# Patient Record
Sex: Female | Born: 1980 | Hispanic: Yes | Smoking: Never smoker
Health system: Southern US, Community
[De-identification: ages and names within clinical notes are randomized; demographics above are authoritative.]

## PROBLEM LIST (undated history)

## (undated) DIAGNOSIS — IMO0002 Reserved for concepts with insufficient information to code with codable children: Secondary | ICD-10-CM

## (undated) DIAGNOSIS — I10 Essential (primary) hypertension: Secondary | ICD-10-CM

## (undated) DIAGNOSIS — T7840XA Allergy, unspecified, initial encounter: Secondary | ICD-10-CM

## (undated) HISTORY — DX: Allergy, unspecified, initial encounter: T78.40XA

## (undated) HISTORY — DX: Reserved for concepts with insufficient information to code with codable children: IMO0002

## (undated) HISTORY — DX: Essential (primary) hypertension: I10

---

## 2002-08-09 ENCOUNTER — Other Ambulatory Visit: Admission: RE | Admit: 2002-08-09 | Discharge: 2002-08-09 | Payer: Self-pay | Admitting: Obstetrics and Gynecology

## 2002-10-15 ENCOUNTER — Inpatient Hospital Stay (HOSPITAL_COMMUNITY): Admission: AD | Admit: 2002-10-15 | Discharge: 2002-10-17 | Payer: Self-pay | Admitting: Family Medicine

## 2004-05-16 ENCOUNTER — Inpatient Hospital Stay (HOSPITAL_COMMUNITY): Admission: AD | Admit: 2004-05-16 | Discharge: 2004-05-16 | Payer: Self-pay | Admitting: Obstetrics & Gynecology

## 2004-05-17 ENCOUNTER — Inpatient Hospital Stay (HOSPITAL_COMMUNITY): Admission: AD | Admit: 2004-05-17 | Discharge: 2004-05-19 | Payer: Self-pay | Admitting: *Deleted

## 2004-05-19 ENCOUNTER — Encounter (INDEPENDENT_AMBULATORY_CARE_PROVIDER_SITE_OTHER): Payer: Self-pay | Admitting: *Deleted

## 2007-09-20 ENCOUNTER — Ambulatory Visit: Payer: Self-pay | Admitting: Family Medicine

## 2007-09-20 ENCOUNTER — Encounter: Payer: Self-pay | Admitting: Family Medicine

## 2007-09-20 LAB — CONVERTED CEMR LAB
Basophils Relative: 0 % (ref 0–1)
Eosinophils Absolute: 0.1 10*3/uL (ref 0.0–0.7)
HCT: 40.7 % (ref 36.0–46.0)
Hepatitis B Surface Ag: NEGATIVE
Lymphocytes Relative: 20 % (ref 12–46)
Lymphs Abs: 1.9 10*3/uL (ref 0.7–3.3)
MCV: 95.3 fL (ref 78.0–100.0)
Neutro Abs: 7.1 10*3/uL (ref 1.7–7.7)
Neutrophils Relative %: 74 % (ref 43–77)
Platelets: 290 10*3/uL (ref 150–400)
Rh Type: POSITIVE

## 2007-09-27 ENCOUNTER — Ambulatory Visit: Payer: Self-pay | Admitting: Family Medicine

## 2007-10-13 ENCOUNTER — Encounter: Payer: Self-pay | Admitting: Family Medicine

## 2007-10-13 ENCOUNTER — Ambulatory Visit: Payer: Self-pay | Admitting: Family Medicine

## 2007-10-13 LAB — CONVERTED CEMR LAB
GC Probe Amp, Genital: NEGATIVE
Whiff Test: NEGATIVE

## 2007-11-17 ENCOUNTER — Ambulatory Visit: Payer: Self-pay | Admitting: Family Medicine

## 2007-11-17 LAB — CONVERTED CEMR LAB
Nitrite: NEGATIVE
Protein, U semiquant: NEGATIVE

## 2007-11-21 ENCOUNTER — Telehealth: Payer: Self-pay | Admitting: Family Medicine

## 2007-11-21 ENCOUNTER — Encounter: Payer: Self-pay | Admitting: Family Medicine

## 2007-11-21 ENCOUNTER — Telehealth (INDEPENDENT_AMBULATORY_CARE_PROVIDER_SITE_OTHER): Payer: Self-pay | Admitting: *Deleted

## 2007-11-21 ENCOUNTER — Ambulatory Visit (HOSPITAL_COMMUNITY): Admission: RE | Admit: 2007-11-21 | Discharge: 2007-11-21 | Payer: Self-pay | Admitting: Family Medicine

## 2007-11-22 ENCOUNTER — Encounter: Payer: Self-pay | Admitting: Family Medicine

## 2007-12-21 ENCOUNTER — Ambulatory Visit: Payer: Self-pay | Admitting: Family Medicine

## 2007-12-21 LAB — CONVERTED CEMR LAB
Nitrite: NEGATIVE
Protein, U semiquant: NEGATIVE

## 2008-01-18 ENCOUNTER — Ambulatory Visit: Payer: Self-pay | Admitting: Family Medicine

## 2008-01-18 ENCOUNTER — Encounter: Payer: Self-pay | Admitting: Family Medicine

## 2008-01-18 LAB — CONVERTED CEMR LAB: GTT, 1 hr: 133 mg/dL

## 2008-02-23 ENCOUNTER — Ambulatory Visit: Payer: Self-pay | Admitting: Family Medicine

## 2008-02-23 LAB — CONVERTED CEMR LAB: Glucose, Urine, Semiquant: NEGATIVE

## 2008-02-29 ENCOUNTER — Ambulatory Visit: Payer: Self-pay | Admitting: Family Medicine

## 2008-03-19 ENCOUNTER — Ambulatory Visit: Payer: Self-pay | Admitting: Family Medicine

## 2008-03-27 ENCOUNTER — Ambulatory Visit: Payer: Self-pay | Admitting: Family Medicine

## 2008-03-27 ENCOUNTER — Encounter: Payer: Self-pay | Admitting: Family Medicine

## 2008-03-27 LAB — CONVERTED CEMR LAB: Glucose, Urine, Semiquant: NEGATIVE

## 2008-04-01 ENCOUNTER — Inpatient Hospital Stay (HOSPITAL_COMMUNITY): Admission: AD | Admit: 2008-04-01 | Discharge: 2008-04-01 | Payer: Self-pay | Admitting: Obstetrics & Gynecology

## 2008-04-01 ENCOUNTER — Ambulatory Visit: Payer: Self-pay | Admitting: *Deleted

## 2008-04-02 ENCOUNTER — Inpatient Hospital Stay (HOSPITAL_COMMUNITY): Admission: AD | Admit: 2008-04-02 | Discharge: 2008-04-03 | Payer: Self-pay | Admitting: Obstetrics & Gynecology

## 2008-04-02 ENCOUNTER — Encounter: Payer: Self-pay | Admitting: Family Medicine

## 2008-04-02 ENCOUNTER — Ambulatory Visit: Payer: Self-pay | Admitting: Family

## 2008-05-07 ENCOUNTER — Ambulatory Visit: Payer: Self-pay | Admitting: Family Medicine

## 2008-05-07 DIAGNOSIS — R8789 Other abnormal findings in specimens from female genital organs: Secondary | ICD-10-CM

## 2008-05-16 ENCOUNTER — Telehealth: Payer: Self-pay | Admitting: *Deleted

## 2008-06-18 ENCOUNTER — Ambulatory Visit: Payer: Self-pay | Admitting: Family Medicine

## 2008-06-25 ENCOUNTER — Encounter: Payer: Self-pay | Admitting: Family Medicine

## 2008-06-25 ENCOUNTER — Ambulatory Visit: Payer: Self-pay | Admitting: Family Medicine

## 2008-07-22 ENCOUNTER — Ambulatory Visit: Payer: Self-pay | Admitting: Family Medicine

## 2008-07-22 LAB — CONVERTED CEMR LAB
MCHC: 33.2 g/dL (ref 30.0–36.0)
MCV: 97.2 fL (ref 78.0–100.0)
Platelets: 302 10*3/uL (ref 150–400)
RBC: 4.31 M/uL (ref 3.87–5.11)

## 2008-07-29 ENCOUNTER — Encounter: Payer: Self-pay | Admitting: Family Medicine

## 2008-08-02 ENCOUNTER — Telehealth: Payer: Self-pay | Admitting: Family Medicine

## 2008-08-12 ENCOUNTER — Ambulatory Visit: Payer: Self-pay | Admitting: Family Medicine

## 2008-09-02 ENCOUNTER — Ambulatory Visit: Payer: Self-pay | Admitting: Family Medicine

## 2008-09-03 ENCOUNTER — Encounter: Payer: Self-pay | Admitting: Family Medicine

## 2008-09-19 ENCOUNTER — Ambulatory Visit: Payer: Self-pay | Admitting: Family Medicine

## 2008-09-27 ENCOUNTER — Ambulatory Visit: Payer: Self-pay | Admitting: Family Medicine

## 2008-10-25 ENCOUNTER — Ambulatory Visit: Payer: Self-pay | Admitting: Family Medicine

## 2008-10-30 ENCOUNTER — Encounter: Payer: Self-pay | Admitting: Family Medicine

## 2008-10-30 ENCOUNTER — Ambulatory Visit: Payer: Self-pay | Admitting: Family Medicine

## 2008-10-30 LAB — CONVERTED CEMR LAB: Whiff Test: NEGATIVE

## 2008-10-31 ENCOUNTER — Encounter: Payer: Self-pay | Admitting: Family Medicine

## 2008-11-01 ENCOUNTER — Encounter: Payer: Self-pay | Admitting: Family Medicine

## 2008-11-15 ENCOUNTER — Ambulatory Visit: Payer: Self-pay | Admitting: Family Medicine

## 2008-11-25 ENCOUNTER — Ambulatory Visit: Payer: Self-pay | Admitting: Family Medicine

## 2008-11-25 ENCOUNTER — Encounter: Payer: Self-pay | Admitting: Family Medicine

## 2008-11-25 LAB — CONVERTED CEMR LAB
Glucose, Urine, Semiquant: NEGATIVE
Protein, U semiquant: NEGATIVE
Specific Gravity, Urine: 1.025
pH: 5.5

## 2008-12-02 LAB — CONVERTED CEMR LAB: Hepatitis B Surface Ag: NEGATIVE

## 2008-12-19 ENCOUNTER — Ambulatory Visit: Payer: Self-pay | Admitting: Family Medicine

## 2009-01-29 ENCOUNTER — Encounter (INDEPENDENT_AMBULATORY_CARE_PROVIDER_SITE_OTHER): Payer: Self-pay | Admitting: *Deleted

## 2009-01-29 ENCOUNTER — Ambulatory Visit: Payer: Self-pay | Admitting: Family Medicine

## 2009-03-28 ENCOUNTER — Encounter (INDEPENDENT_AMBULATORY_CARE_PROVIDER_SITE_OTHER): Payer: Self-pay | Admitting: Family Medicine

## 2009-03-28 ENCOUNTER — Ambulatory Visit: Payer: Self-pay | Admitting: Family Medicine

## 2009-03-28 LAB — CONVERTED CEMR LAB
Beta hcg, urine, semiquantitative: NEGATIVE
GC Probe Amp, Genital: NEGATIVE

## 2009-03-31 ENCOUNTER — Encounter (INDEPENDENT_AMBULATORY_CARE_PROVIDER_SITE_OTHER): Payer: Self-pay | Admitting: Family Medicine

## 2009-04-01 ENCOUNTER — Telehealth (INDEPENDENT_AMBULATORY_CARE_PROVIDER_SITE_OTHER): Payer: Self-pay | Admitting: *Deleted

## 2009-04-02 ENCOUNTER — Ambulatory Visit (HOSPITAL_COMMUNITY): Admission: RE | Admit: 2009-04-02 | Discharge: 2009-04-02 | Payer: Self-pay | Admitting: Family Medicine

## 2009-04-22 ENCOUNTER — Ambulatory Visit: Payer: Self-pay | Admitting: Family Medicine

## 2009-04-22 ENCOUNTER — Encounter: Payer: Self-pay | Admitting: Family Medicine

## 2009-04-25 ENCOUNTER — Encounter: Payer: Self-pay | Admitting: Family Medicine

## 2009-04-28 ENCOUNTER — Ambulatory Visit: Payer: Self-pay | Admitting: Family Medicine

## 2009-04-28 ENCOUNTER — Encounter: Payer: Self-pay | Admitting: Family Medicine

## 2009-09-15 ENCOUNTER — Ambulatory Visit: Payer: Self-pay | Admitting: Family Medicine

## 2009-11-21 ENCOUNTER — Encounter: Payer: Self-pay | Admitting: Family Medicine

## 2009-11-21 ENCOUNTER — Ambulatory Visit: Payer: Self-pay | Admitting: Family Medicine

## 2009-11-21 LAB — CONVERTED CEMR LAB: Beta hcg, urine, semiquantitative: NEGATIVE

## 2009-12-13 DIAGNOSIS — IMO0002 Reserved for concepts with insufficient information to code with codable children: Secondary | ICD-10-CM

## 2009-12-13 HISTORY — DX: Reserved for concepts with insufficient information to code with codable children: IMO0002

## 2009-12-31 ENCOUNTER — Encounter: Payer: Self-pay | Admitting: Family Medicine

## 2009-12-31 ENCOUNTER — Ambulatory Visit: Payer: Self-pay | Admitting: Family Medicine

## 2009-12-31 DIAGNOSIS — R109 Unspecified abdominal pain: Secondary | ICD-10-CM | POA: Insufficient documentation

## 2009-12-31 LAB — CONVERTED CEMR LAB
Chlamydia, DNA Probe: NEGATIVE
GC Probe Amp, Genital: NEGATIVE
Whiff Test: NEGATIVE

## 2010-01-29 ENCOUNTER — Ambulatory Visit: Payer: Self-pay | Admitting: Family Medicine

## 2010-02-03 ENCOUNTER — Telehealth: Payer: Self-pay | Admitting: Family Medicine

## 2010-02-05 ENCOUNTER — Encounter (INDEPENDENT_AMBULATORY_CARE_PROVIDER_SITE_OTHER): Payer: Self-pay | Admitting: Family Medicine

## 2010-02-12 ENCOUNTER — Encounter: Payer: Self-pay | Admitting: Family Medicine

## 2010-02-16 ENCOUNTER — Encounter: Payer: Self-pay | Admitting: Family Medicine

## 2010-02-25 ENCOUNTER — Ambulatory Visit: Payer: Self-pay | Admitting: Obstetrics and Gynecology

## 2010-02-25 ENCOUNTER — Encounter: Payer: Self-pay | Admitting: Family Medicine

## 2010-02-26 ENCOUNTER — Encounter: Payer: Self-pay | Admitting: Family

## 2010-02-26 LAB — CONVERTED CEMR LAB: Chlamydia, DNA Probe: NEGATIVE

## 2010-02-27 ENCOUNTER — Encounter: Payer: Self-pay | Admitting: Family

## 2010-02-27 LAB — CONVERTED CEMR LAB: Trich, Wet Prep: NONE SEEN

## 2010-03-06 ENCOUNTER — Emergency Department (HOSPITAL_COMMUNITY): Admission: EM | Admit: 2010-03-06 | Discharge: 2010-03-06 | Payer: Self-pay | Admitting: Emergency Medicine

## 2010-03-12 ENCOUNTER — Ambulatory Visit (HOSPITAL_COMMUNITY): Admission: RE | Admit: 2010-03-12 | Discharge: 2010-03-12 | Payer: Self-pay | Admitting: Obstetrics and Gynecology

## 2010-03-18 ENCOUNTER — Ambulatory Visit: Payer: Self-pay | Admitting: Obstetrics and Gynecology

## 2010-03-24 ENCOUNTER — Ambulatory Visit: Payer: Self-pay | Admitting: Family Medicine

## 2010-03-24 DIAGNOSIS — R87610 Atypical squamous cells of undetermined significance on cytologic smear of cervix (ASC-US): Secondary | ICD-10-CM

## 2010-03-24 LAB — CONVERTED CEMR LAB: Pap Smear: UNDETERMINED

## 2010-04-06 ENCOUNTER — Encounter: Payer: Self-pay | Admitting: Family Medicine

## 2010-04-09 ENCOUNTER — Ambulatory Visit: Payer: Self-pay | Admitting: Family Medicine

## 2010-12-21 ENCOUNTER — Emergency Department (HOSPITAL_COMMUNITY)
Admission: EM | Admit: 2010-12-21 | Discharge: 2010-12-21 | Payer: Self-pay | Source: Home / Self Care | Admitting: Emergency Medicine

## 2011-01-04 ENCOUNTER — Ambulatory Visit: Admission: RE | Admit: 2011-01-04 | Discharge: 2011-01-04 | Payer: Self-pay | Source: Home / Self Care

## 2011-01-04 DIAGNOSIS — R21 Rash and other nonspecific skin eruption: Secondary | ICD-10-CM | POA: Insufficient documentation

## 2011-01-04 DIAGNOSIS — I776 Arteritis, unspecified: Secondary | ICD-10-CM | POA: Insufficient documentation

## 2011-01-12 NOTE — Consult Note (Signed)
Summary: [facility]  [facility]   Imported By: Clydell Hakim 04/13/2010 11:39:57  _____________________________________________________________________  External Attachment:    Type:   Image     Comment:   External Document

## 2011-01-12 NOTE — Assessment & Plan Note (Signed)
Summary: rash, boils   Vital Signs:  Patient profile:   30 year old female Weight:      140.6 pounds BMI:     26.66 Temp:     97.9 degrees F oral Pulse rate:   96 / minute Pulse rhythm:   regular BP sitting:   117 / 79  (right arm) Cuff size:   regular  Vitals Entered By: Loralee Pacas CMA (January 29, 2010 1:49 PM) CC: rash, bumps on buttocks Comments pt has a hx of red spots that just "pop" up all over her body.  this started 3 wks ago no changes in soaps   Primary Care Provider:  Lequita Asal  MD  CC:  rash and bumps on buttocks.  History of Present Illness: 30 y/o female with h/o recurrent rash > 1 1/2 years. has been evaluated by dermatologist and numerous physicians at [facility]. has tried several topical steroids over that period of time. no known triggers. no exact diagnosis from past bx, per patient. shaves legs twice weekly using gel. because of rash, feels it is affecting her relationship and attractiveness to her husband.   bumps on buttocks- recurrent issue for several months. denies fever, chills. occasional drainage. some tenderness. has never required I&D.   Current Medications (verified): 1)  Ibuprofen 800 Mg Tabs (Ibuprofen) .Marland Kitchen.. 1 Tab By Mouth Q 8 Hours As Needed Pain 2)  Doxycycline Hyclate 100 Mg Caps (Doxycycline Hyclate) .... One Tab By Mouth Two Times A Day X10 Days 3)  Triamcinolone Acetonide 0.025 % Oint (Triamcinolone Acetonide) .... Apply To Legs Two Times A Day As Needed For Rash. Please Put Label in Spanish. Disp 90g.  Allergies (verified): No Known Drug Allergies  Physical Exam  General:  Well-developed,well-nourished,in no acute distress; alert,appropriate and cooperative throughout examination VSS Skin:  diffuse maculopapular rash on bilateral lower extremities from inguinal creases to ankles. sparing feet and upper body. on bilateral buttocks, 2-3 papular lesions and areas of excoriation. no induration, fluctuance, or warmth.   Impression &  Recommendations:  Problem # 1:  SKIN RASH (ICD-782.1) Assessment Deteriorated seems consistent with folliculitis. will treat with doxycycline. will try topical steroid as well.   Her updated medication list for this problem includes:    Triamcinolone Acetonide 0.025 % Oint (Triamcinolone acetonide) .Marland Kitchen... Apply to legs two times a day as needed for rash. please put label in spanish. disp 90g.  Orders: [facility]- Est Level  3 (62130)  Problem # 2:  CARBUNCLE AND FURUNCLE OF BUTTOCK (ICD-680.5) Assessment: New no area amenable to I&D. will treat with oral antibiotics.   Orders: [facility]- Est Level  3 (86578)  Complete Medication List: 1)  Ibuprofen 800 Mg Tabs (Ibuprofen) .Marland Kitchen.. 1 tab by mouth q 8 hours as needed pain 2)  Doxycycline Hyclate 100 Mg Caps (Doxycycline hyclate) .... One tab by mouth two times a day x10 days 3)  Triamcinolone Acetonide 0.025 % Oint (Triamcinolone acetonide) .... Apply to legs two times a day as needed for rash. please put label in spanish. disp 90g. Prescriptions: TRIAMCINOLONE ACETONIDE 0.025 % OINT (TRIAMCINOLONE ACETONIDE) apply to legs two times a day as needed for RASH. please put label in SPANISH. disp 90g.  #1 x 0   Entered and Authorized by:   Lequita Asal  MD   Signed by:   Lequita Asal  MD on 02/02/2010   Method used:   Electronically to        [facility] [address] (retail)  [address]       [place], [place]  [address]       Ph: [phone]       Fax: [phone]   RxID:   [id number] DOXYCYCLINE HYCLATE 100 MG CAPS (DOXYCYCLINE HYCLATE) one tab by mouth two times a day x10 days  #20 x 0   Entered and Authorized by:   Lequita Asal  MD   Signed by:   Lequita Asal  MD on 01/29/2010   Method used:   Electronically to        [facility] [address] (retail)       [phone]       [place], [place]  [address]       Ph: [phone]       Fax: [phone]    RxID:   [id number]

## 2011-01-12 NOTE — Progress Notes (Signed)
Summary: referral  Phone Note Call from Patient Call back at [phone]   Caller: Drucie Ip for [name] [facility] Summary of Call: pt is wanting to go to derm and needs a referral Initial call taken by: De Nurse,  February 03, 2010 9:47 AM  Follow-up for Phone Call        will forward to MD to put in order. Follow-up by: Theresia Lo RN,  February 03, 2010 10:42 AM

## 2011-01-12 NOTE — Consult Note (Signed)
Summary: [facility]  [facility]   Imported By: Clydell Hakim 07/16/2010 12:08:27  _____________________________________________________________________  External Attachment:    Type:   Image     Comment:   External Document

## 2011-01-12 NOTE — Miscellaneous (Signed)
Summary: rash on legs  Clinical Lists Changes she called again asking for somethiing for her legs. states it is somewhat better. asked her to wait until monday & if worsening, call back for appt. used interpretor.Marland KitchenMarland KitchenMarland KitchenGolden Circle RN  February 12, 2010 10:00 AM  i sent steroid cream last visit.  Lequita Asal  MD  February 12, 2010 10:40 AM

## 2011-01-12 NOTE — Assessment & Plan Note (Signed)
Summary: abdominal pain when having sex   Vital Signs:  Patient profile:   30 year old female Weight:      138.7 pounds Temp:     98.3 degrees F oral Pulse rate:   67 / minute Pulse rhythm:   regular BP sitting:   113 / 76  (left arm) Cuff size:   regular  Vitals Entered By: Loralee Pacas CMA (December 31, 2009 2:21 PM)  Primary Care Provider:  Lequita Asal  MD   History of Present Illness: patient has been having pelvic/vaginal pain for several weeks.  Mostly w/ sex.  no discharge.  has never had STD.  Has IUD.  No dysuria.  No fevers. had exact same pain last year which went away by itself. normal U/S and labs. no itching.pain so bad she can't have sex.  patient denies h/o sexual abuse, any sadness/depression, concerns for safety with husband, etc. denies constipation, diarrhea, dysuria.   Current Medications (verified): 1)  Ibuprofen 800 Mg Tabs (Ibuprofen) .Marland Kitchen.. 1 Tab By Mouth Q 8 Hours As Needed Pain  Allergies (verified): No Known Drug Allergies  Physical Exam  General:  Well-developed,well-nourished,in no acute distress; alert,appropriate and cooperative throughout examination VSS Genitalia:  Normal introitus for age, no external lesions, no vaginal discharge, mucosa pink and moist, no vaginal or cervical lesions, no vaginal atrophy, no friaility or hemorrhage, normal uterus size and position, no adnexal masses or tenderness. no pain with insertion of speculum. minimal pain with bimanual exam   Impression & Recommendations:  Problem # 1:  ABDOMINAL PAIN, UNSPECIFIED SITE (ICD-789.00) Assessment Unchanged etiology unclear. wet prep unremarkable. ?underlying psych cause, but normal depression screen and patient denies. will refer to GYN for chronic pelvic pain. patient declines any meds at this time.   Orders: GC/Chlamydia-[facility] (87591/87491) Wet Prep- [facility] ([address]) [facility]- Est Level  3 (60454)  Complete Medication List: 1)  Ibuprofen 800 Mg Tabs (Ibuprofen) .Marland Kitchen.. 1 tab  by mouth q 8 hours as needed pain  Other Orders: Gynecologic Referral (Gyn)  Laboratory Results  Date/Time Received: December 31, 2009 2:56 PM  Date/Time Reported: December 31, 2009 3:11 PM   [employer] Source: vag WBC/hpf: 5-10 Bacteria/hpf: 2+  [place] Clue cells/hpf: none  Negative whiff Yeast/hpf: none Trichomonas/hpf: none Comments: ...............test performed by......Marland KitchenBonnie A. [place], MLS (ASCP)cm

## 2011-01-12 NOTE — Miscellaneous (Signed)
Summary: wants referral to Dr. Terri Piedra  Clinical Lists Changes Helmut Muster, our interpretor called asking that we set pt up with Dr. Terri Piedra at pt's request. sent to pcp to review & order if desired.Golden Circle RN  February 05, 2010 10:57 AM  we will. unfortunately, patient without insurance. only [place]. but referral in.  Lequita Asal  MD  February 05, 2010 11:08 AM  Orders: Added new Referral order of Dermatology Referral (Derma) - Signed will fax referral to [facility]. Theresia Lo RN  February 05, 2010 5:08 PM

## 2011-01-12 NOTE — Miscellaneous (Signed)
Summary: Consent to colposcopy  Consent to colposcopy   Imported By: Knox Royalty 05/21/2010 10:16:57  _____________________________________________________________________  External Attachment:    Type:   Image     Comment:   External Document

## 2011-01-12 NOTE — Assessment & Plan Note (Signed)
Summary: [facility]   Vital Signs:  Patient profile:   30 year old female Height:      61 inches Weight:      141.2 pounds BMI:     26.78 Temp:     98.1 degrees F oral Pulse rate:   76 / minute BP sitting:   115 / 81  (left arm) Cuff size:   regular  Vitals Entered By: Gladstone Pih (March 24, 2010 9:09 AM)  Nutrition Counseling: Patient's BMI is greater than 25 and therefore counseled on weight management options. CC: CPE and pap Is Patient Diabetic? No Pain Assessment Patient in pain? no        Primary Care Provider:  Lequita Asal  MD  CC:  CPE and pap.  History of Present Illness: 30 y/o female here for CPE  rash- resolved after seeing Dr. Terri Piedra. Diagnosed with Purpura NOS  dyspareunia- resolved since ultrasound and seeing GYN. continued decreased libido 2/2 fear of pain.   no other changes to medical history. denies depression/sadness. feels safe at home.   Habits & Providers  Alcohol-Tobacco-Diet     Tobacco Status: never  Exercise-Depression-Behavior     Have you felt down or hopeless? no     Have you felt little pleasure in things? no     Depression Counseling: not indicated; screening negative for depression     STD Risk: never  Current Medications (verified): 1)  None  Allergies (verified): No Known Drug Allergies  Past History:  Past medical, surgical, family and social histories (including risk factors) reviewed, and no changes noted (except as noted below).  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 mother- DM Type II, Hypercholesterolemia  Social History: Reviewed history from 11/15/2008 and no changes required. Married Native of [place] in [place]. 6 years Language: Spanish, reads it well  Daughter born 4/09 Never Smoked STD Risk:  never  Physical Exam  General:   Well-developed,well-nourished,in no acute distress; alert,appropriate and cooperative throughout examination Eyes:  No corneal or conjunctival inflammation noted. EOMI. Perrla. Vision grossly normal. Ears:  External ear exam shows no significant lesions or deformities.   Hearing is grossly normal bilaterally. Nose:  External nasal examination shows no deformity or inflammation. Nasal mucosa are pink and moist without lesions or exudates. Mouth:  Oral mucosa and oropharynx without lesions or exudates.  Teeth in good repair. Neck:  No deformities, masses, or tenderness noted. Breasts:  No mass, nodules, thickening, tenderness, bulging, retraction, inflamation, nipple discharge or skin changes noted.   Lungs:  Normal respiratory effort, chest expands symmetrically. Lungs are clear to auscultation, no crackles or wheezes. Heart:  Normal rate and regular rhythm. S1 and S2 normal without gallop, murmur, click, rub or other extra sounds. Abdomen:  Bowel sounds positive,abdomen soft and non-tender without masses, organomegaly or hernias noted. Genitalia:  Normal introitus for age, no external lesions, no vaginal discharge, mucosa pink and moist, no vaginal or cervical lesions, no vaginal atrophy, no friaility or hemorrhage, normal uterus size and position, no adnexal masses or tenderness. IUD strings visualized at cervical os Pulses:  R and L carotid,radial,dorsalis pedis and posterior tibial pulses are full and equal bilaterally Extremities:  No clubbing, cyanosis, edema, or deformity noted with normal full range of motion of all joints.   Neurologic:  alert & oriented X3, cranial nerves II-XII intact, and gait normal.   Skin:  Intact without suspicious lesions or rashes Psych:  Oriented X3, memory intact for recent and remote, and normally interactive.     Impression & Recommendations:  Problem # 1:  ROUTINE GYNECOLOGICAL EXAMINATION (ICD-V72.31) Assessment New  pap completed. h/o LGSIL requiring  colposcopy. f/u as needed depending on results.   Orders: [facility] - Est  18-39 yrs (04540)  Problem # 2:  VAGINITIS, BACTERIAL (ICD-616.10) Assessment: New  rx for flagyl to pharmacy.  Her updated medication list for this problem includes:    Metronidazole 500 Mg Tabs (Metronidazole) ..... One tab by mouth two times a day x7 days  [facility] Pap Smear-[facility] ([id number]) Prescriptions: METRONIDAZOLE 500 MG TABS (METRONIDAZOLE) one tab by mouth two times a day x7 days  #14 x 0   Entered and Authorized by:   Lequita Asal  MD   Signed by:   Lequita Asal  MD on 03/24/2010   Method used:   Electronically to        [facility] [address] (retail)       [phone]       [place], [place]  [address]       Ph: [phone]       Fax: [phone]   RxID:   [id number]   Laboratory Results  Date/Time Received: March 24, 2010 9:37 AM  Date/Time Reported: March 24, 2010 9:44 AM   Wet [place] Source: vag WBC/hpf: 1-5 Bacteria/hpf: 2+  Cocci Clue cells/hpf: many  Positive whiff Yeast/hpf: none Trichomonas/hpf: none Comments: ...............test performed by......Marland KitchenBonnie A. [place], MLS (ASCP)cm     Appended Document: Orders Update    Clinical Lists Changes  Problems: Added new problem of ASCUS PAP (ICD-795.01) Orders: Added new Test order of HPV Typing-[facility] [phone]) - Signed

## 2011-01-12 NOTE — Assessment & Plan Note (Signed)
Summary: colpo abnormal pap,tcb   Vital Signs:  Patient profile:   30 year old female Height:      61 inches Weight:      139.8 pounds BMI:     26.51 Temp:     98.2 degrees F oral Pulse rate:   73 / minute BP sitting:   122 / 82  (left arm) Cuff size:   regular  Vitals Entered By: Gladstone Pih (April 09, 2010 9:21 AM) CC: Colpo Is Patient Diabetic? No Pain Assessment Patient in pain? no        Primary Care Provider:  Lequita Asal  MD  CC:  Colpo.  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 has myrena in place was seen recently at [facility] for dyspareunia--very occasional pain with intercourse, does not seem ot be positional. also can have this pain withoout intercourse. pain is over bladder area, sharp and crampy--but lasts only a minute and then self resolves  Habits & Providers  Alcohol-Tobacco-Diet     Tobacco Status: never  Allergies: No Known Drug Allergies  Past History:  Past Medical History: Reviewed history from 03/24/2010 and no changes required. [id number], 3 NSVD no medical problems normal colpo 06/2008 for f/u LGSIL  Physical Exam  Genitalia:  normal introitus, no external lesions, no vaginal discharge, and mucosa pink and moist.  Mirena strings coming from os Additional Exam:  Patient given informed consent, signed copy in the chart. Placed in lithotomy position. Cervix viewed with speculum and colposcope. Was the entire squamocolumnar junction seen?yes she has ectropion Any acetowhite lesions noted?none Any abnormalities seen with green filter?no Any abnormalities seen with application of Lugol's solution?no Was the endocervical canal sampled?no Were any cervical biopsies taken?no Were there any complications?no COMMENTS: Patient was given post procedure instructions:.no restrictions graciella was interpretor and present for entire visit    Impression & Recommendations:  Problem #  1:  ASCUS PAP (ICD-795.01)  clinicaly normal colpo repeat pap one year  Orders: Colposcopy w/out biopsy [facility] ([address])  Problem # 2:  PELVIC PAIN, CHRONIC (ICD-789.09)  was seen at [facility] for dysp[aeunia--they evidently did [place] and her IUD is projecting into myometrium.OBGYN offered rher emoval of IUD which she declined. After discussion today, it does not sound like classic dysapeunia--I wonder if she is having bladder spasm?? it seems very transient if it truly lasts only a minute. recommend otc ibuprofen and f/u if it worsens  Orders: [facility]- Est Level  3 ([address])

## 2011-01-14 NOTE — Assessment & Plan Note (Signed)
Summary: rash/red spot on her leg/mj/white team   Vital Signs:  Patient profile:   30 year old female Height:      61 inches Weight:      140 pounds BMI:     26.55 Temp:     98.3 degrees F oral Pulse rate:   74 / minute BP sitting:   119 / 71  (left arm) Cuff size:   regular  Vitals Entered By: Tessie Fass CMA (January 04, 2011 1:55 PM)   Primary Care Provider:  Lequita Asal  MD   History of Present Illness: 1. Rash on legs:  Pt returns to clinic for reevaluation of rash on her legs. The rash is described as a red spots that are itchy. They have been there for a couple of weeks.  This is a chronic problem.  She was last seen by Dermatology about 1 year ago.  She was given a shot and some pills and the rash improved.  She would like to be referred back there for the same treatment.    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  She has received Kenalog injection and Prednisone along with a Rx for Amoxicillin at last Dermatology visit.  Current Medications (verified): 1)  None  Allergies: No Known Drug Allergies  Past History:  Past Medical History: Reviewed history from 03/24/2010 and no changes required. [id number], 3 NSVD no medical problems normal colpo 06/2008 for f/u LGSIL  Social History: Reviewed history from 11/15/2008 and no changes required. Married Native of [place], [place], in [place]. 6 years Language: Spanish, reads it well  Daughter born 4/09 Never Smoked  Physical Exam  General:  Vitals reviewed.  Comfortable.  NAD Lungs:  Normal respiratory effort, chest expands symmetrically. Lungs are clear to auscultation, no crackles or wheezes. Heart:  Normal rate and regular rhythm. S1 and S2 normal without gallop, murmur, click, rub or other extra sounds. Skin:  Petechial rash on right leg.  Not particularly inflamed.  No surrounding cellulitis.   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  Has been followed by Dermatology.  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  It seems to respond well to steroids and has been gone for almost one year.  Gave her steroid injection here.  Advised her to follow up in 2 weeks to recheck.  If not better can always refer back to Dermatology.  Orders: [facility]- Est Level  3 (57846)  Patient Instructions: 1)  We will try and give you a steroid injection and see if that helps with your rash 2)  That is what you got at your last dermatology appointment 3)  Please schedule a follow up appointment in 2 week 4)  If not better with the injection in clinic we can always refer you back to dermatology   Orders Added: 1)  [facility]- Est Level  3 [[id number]]  Appended Document: rash/red spot on her leg/mj/white team     Allergies: No Known Drug Allergies   Other Orders: Solumedrol up to 125mg  ([id number])   Medication Administration  Injection # 1:    Medication: Solumedrol up to 125mg     Diagnosis: SKIN RASH (ICD-782.1)    Route: IM    Site: RUOQ gluteus  Exp Date: 05/2013    Lot #: Laray [id number]    Mfr: pfizer    Comments: 125mg  given    Patient tolerated injection without complications    Given by: Tessie Fass CMA (January 04, 2011 2:34 PM)  Orders Added: 1)  Solumedrol up to 125mg  [[id number]]

## 2011-01-20 ENCOUNTER — Ambulatory Visit: Payer: Self-pay | Admitting: Family Medicine

## 2011-02-01 ENCOUNTER — Ambulatory Visit: Payer: Self-pay | Admitting: Family Medicine

## 2011-04-05 ENCOUNTER — Ambulatory Visit (INDEPENDENT_AMBULATORY_CARE_PROVIDER_SITE_OTHER): Payer: Self-pay | Admitting: Family Medicine

## 2011-04-05 ENCOUNTER — Encounter: Payer: Self-pay | Admitting: Family Medicine

## 2011-04-05 ENCOUNTER — Other Ambulatory Visit (HOSPITAL_COMMUNITY)
Admission: RE | Admit: 2011-04-05 | Discharge: 2011-04-05 | Disposition: A | Discharge status: Home / Self Care | Payer: Self-pay | Source: Ambulatory Visit | Attending: Family Medicine | Admitting: Family Medicine

## 2011-04-05 VITALS — BP 126/70 | Temp 98.0°F | Ht 60.0 in | Wt 133.0 lb

## 2011-04-05 DIAGNOSIS — Z113 Encounter for screening for infections with a predominantly sexual mode of transmission: Secondary | ICD-10-CM | POA: Insufficient documentation

## 2011-04-05 DIAGNOSIS — N760 Acute vaginitis: Secondary | ICD-10-CM

## 2011-04-05 DIAGNOSIS — R8789 Other abnormal findings in specimens from female genital organs: Secondary | ICD-10-CM

## 2011-04-05 DIAGNOSIS — Z01419 Encounter for gynecological examination (general) (routine) without abnormal findings: Secondary | ICD-10-CM | POA: Insufficient documentation

## 2011-04-05 LAB — POCT WET PREP (WET MOUNT): Yeast Wet Prep HPF POC: NEGATIVE

## 2011-04-05 LAB — RPR

## 2011-04-05 NOTE — Assessment & Plan Note (Signed)
HIV, RPR, GC, Chlam, Wet prep samples sent.

## 2011-04-05 NOTE — Progress Notes (Signed)
Addended by: Tessie Fass on: 04/05/2011 04:44 PM   Modules accepted: Orders

## 2011-04-05 NOTE — Assessment & Plan Note (Signed)
Abnormal pap 2010, with normal colposcopy.  She is on annual schedule now for repeat pap. No risk factors on ROS (pt is married, with 3 children and not interested in fertility, she has IUD placed 2009).

## 2011-04-05 NOTE — Progress Notes (Deleted)
  Subjective:    Patient ID: [name], female    DOB: [birth year], 28 y.o.   MRN: [id number]  HPI    Review of Systems     Objective:   Physical Exam        Assessment & Plan:

## 2011-04-05 NOTE — Progress Notes (Signed)
  Subjective:    Patient ID: [name], female    DOB: 1981-07-02, 30 y.o.   MRN: [id number]  HPI Pt is here for annual exam, including pap smear. She has a history of abnormal pap smear last year LGSIL, and had colposcopy was negative.     Review of Systems  Constitutional: Negative for fever, chills and unexpected weight change.  HENT: Positive for rhinorrhea. Negative for hearing loss, ear pain and neck pain.   Eyes: Positive for itching. Negative for visual disturbance.  Respiratory: Negative for cough, choking, chest tightness and shortness of breath.   Cardiovascular: Negative for chest pain, palpitations and leg swelling.  Gastrointestinal: Positive for abdominal pain. Negative for nausea, vomiting, constipation and blood in stool.  Genitourinary: Negative for dysuria, frequency, vaginal bleeding and vaginal discharge.  Musculoskeletal: Negative.   Skin: Negative.   Neurological: Negative for dizziness, syncope, weakness and headaches.  Hematological: Negative.   Psychiatric/Behavioral: Negative.    No vaginal discharge, abd bleeding.  LMP: 3 yrs, she sometimes has spotting.  Has Mirena for contraception, place 2009.     Objective:   Physical Exam  Constitutional: She is oriented to person, place, and time. She appears well-developed and well-nourished. No distress.  HENT:  Head: Normocephalic and atraumatic.  Eyes: EOM are normal. Pupils are equal, round, and reactive to light.  Cardiovascular: Normal rate, regular rhythm and normal heart sounds.   No murmur heard. Pulmonary/Chest: Effort normal and breath sounds normal. No respiratory distress. She has no wheezes.  Abdominal: Soft. Bowel sounds are normal. She exhibits no distension and no mass. There is no tenderness.  Genitourinary: Vagina normal and uterus normal. There is no rash, tenderness, lesion or injury on the right labia. There is no rash, tenderness, lesion or injury on the left labia. Cervix  exhibits motion tenderness. Cervix exhibits no discharge and no friability. Right adnexum displays fullness. Right adnexum displays no mass and no tenderness. Left adnexum displays no mass, no tenderness and no fullness.  Musculoskeletal: Normal range of motion. She exhibits no edema.  Neurological: She is alert and oriented to person, place, and time. She has normal reflexes.  Skin: Skin is warm and dry.          Assessment & Plan:

## 2011-04-07 ENCOUNTER — Other Ambulatory Visit: Payer: Self-pay | Admitting: Family Medicine

## 2011-04-07 ENCOUNTER — Encounter: Payer: Self-pay | Admitting: Family Medicine

## 2011-04-07 MED ORDER — METRONIDAZOLE 500 MG PO TABS: 500.0000 mg | ORAL_TABLET | Freq: Two times a day (BID) | ORAL | Status: AC

## 2011-04-26 ENCOUNTER — Ambulatory Visit (INDEPENDENT_AMBULATORY_CARE_PROVIDER_SITE_OTHER): Payer: Self-pay | Admitting: Family Medicine

## 2011-04-26 VITALS — BP 120/80 | HR 90 | Temp 98.5°F | Ht 60.0 in | Wt 128.5 lb

## 2011-04-26 DIAGNOSIS — R87610 Atypical squamous cells of undetermined significance on cytologic smear of cervix (ASC-US): Secondary | ICD-10-CM

## 2011-04-26 DIAGNOSIS — B373 Candidiasis of vulva and vagina: Secondary | ICD-10-CM

## 2011-04-26 DIAGNOSIS — B3731 Acute candidiasis of vulva and vagina: Secondary | ICD-10-CM | POA: Insufficient documentation

## 2011-04-26 DIAGNOSIS — Z113 Encounter for screening for infections with a predominantly sexual mode of transmission: Secondary | ICD-10-CM

## 2011-04-26 DIAGNOSIS — R300 Dysuria: Secondary | ICD-10-CM

## 2011-04-26 LAB — POCT URINALYSIS DIPSTICK
Bilirubin, UA: NEGATIVE
Blood, UA: NEGATIVE
Ketones, UA: NEGATIVE
Leukocytes, UA: NEGATIVE
Protein, UA: NEGATIVE
Spec Grav, UA: 1.01
pH, UA: 7

## 2011-04-26 MED ORDER — FLUCONAZOLE 150 MG PO TABS: 150.0000 mg | ORAL_TABLET | Freq: Once | ORAL | Status: AC

## 2011-04-26 NOTE — Assessment & Plan Note (Signed)
Since it was positive for HPV and her history of LGSIL will refer to colposcopy clinic

## 2011-04-26 NOTE — Progress Notes (Signed)
  Subjective:    Patient ID: [name], female    DOB: 1981-04-15, 30 y.o.   MRN: [id number]  HPIMetronidazole was called in for treatment of Clue Cells on her last visit wet prep. After taking that she developed dysuria without urgency or frequency. She doesn't have much discharge, but has a lot of itching. She hasn't been informed of her pap results yet. She did have colposcopy for an abnormal pap 3 years ago.     Review of Systems     Objective:   Physical Exam  Genitourinary: There is no rash, tenderness or lesion on the right labia. There is no rash, tenderness or lesion on the left labia. Cervix exhibits no motion tenderness and no friability. Right adnexum displays no mass and no tenderness. Left adnexum displays no mass and no tenderness. No erythema or tenderness around the vagina. No foreign body around the vagina. Vaginal discharge found.  GYN exam by Cat Ta, MD          Assessment & Plan:

## 2011-04-26 NOTE — Patient Instructions (Signed)
Please schedule an appointment in [facility] clinic for colposcopy.  Please bring an interpretor with you. You can take Diflucan 150mg  one tablet for yeast infection.  Virus del Geneticist, molecular (VPH) (Human Papilloma Virus, (HPV) El virus del papiloma humano es la ETS (enfermedad de transmisin sexual) ms comn y es altamente contagiosa. Hay ms de 100 tipos de VPH. Cuatro tipos de VPH son los responsables de ocasionar el 70% del cncer cervical. Este es el problema ms serio cuando se tiene una infeccin por VPH. A menos que tenga verrugas genitales que pueda ver o tocar, el VPH no ocasiona sntomas. Por lo tanto, las personas pueden estar infectadas por largos perodos de [place] y pasarlo a otras sin saberlo. El noventa por ciento de las verrugas genitales estn ocasionadas por VPH. El VPH en el embarazo no suele causar problemas ni a la madre ni al beb. Si la madre tiene verrugas genitales, el beb rara vez se infecta. Cuando la infeccin por VPH parece ser precancerosa en el cuello del tero, la vagina o la vulva, la madre deber ser controlada cercanamente durante el [place]. Cualquier tratamiento necesario se realizar despus de nacido el beb. CAUSAS  Tener sexo sin proteccin Puede transmitirse practicando sexo vaginal, anal u oral.   Tener mltiples compaeros sexuales.   Tener un compaero sexual que tiene otros International aid/development worker.   Tener o haber tenido otras enfermedades de transmisin sexual.   Hay un mayor riesgo de Software engineer. La mujeres de 20 aos y las personas de bajo Academic librarian.  SNTOMAS  Mas del 90% de las personas que tienen VPH no pueden decir que tienen algn problema.   Verrugas como llagas en la garganta (por tener sexo oral).   Verrugas (color en la piel, rosado, rojo o marrn) en las zonas infectadas (cuello del tero, vagina, vulva, perineo y zona anal). Es mas fcil de notar en la piel infectada o membranas mucosas.   Las Production designer, theatre/television/film, arder o Geophysicist/field seismologist.   Las Civil Service fast streamer pueden ser dolorosas durante las relaciones sexuales.  DIAGNSTICO  Las verrugas genitales pueden observarse a simple vista para el anlisis diagnstico.   Una anlisis de Pap puede mostrar clulas que han sido infectadas con VPH.   La colposcopa es un instrumento que magnifica y Office manager las clulas cuando se aplican ciertas soluciones en las zonas sospechadas.   Tambin podrn practicarle una biopsia. Es un procedimiento por el cual se extirpa una pequea muestra de tejido (de las zonas sospechadas) para examinarlo en el microscopio.  TRATAMIENTO  Las verrugas genitales pueden tratarse con:   Podofilina, una aplicacin de una pasta a las verrugas.  Acido bicloroactico o tricloroactico en forma lquida.   Inyeccin de interfern.   Crioterapia, congelamiento de verrugas.  Tratamiento lser a las Public affairs consultant.   Electrocauterizacin para Medical sales representative.   Remocin quirrgica de [employer]    La infeccin en el crvix, la vagina o la vulva pueden tratarse con:   Crioterapia.   Lser.   Electrocauterizacin.   Remocin [place].  El profesional que lo asiste lo controlar peridicamente una vez comenzado el [place]. Esto se debe a que Microbiologist y podra tener que tratarse nuevamente. INSTRUCCIONES PARA EL CUIDADO DOMICILIARIO  Siga las instrucciones del profesional que lo asiste con respecto al uso de medicamentos, Papanicolau y exmenes de seguimiento.   No toque o rasque las verrugas.   No intente tratar las verrugas genitales con el mismo medicamento que se  utiliza para las [employer].   Comente con su compaero sexual acerca de su infeccin porque podra tambin Administrator.   No tenga relaciones sexuales mientras recibe el tratamiento.   Luego del tratamiento, utilice condones durante las relaciones sexuales para prevenir la reinfeccin.    Janie Morning un solo compaero sexual.   Janie Morning un compaero sexual que no tiene otros International aid/development worker.   Puede utilizar cremas de venta libre para la picazn o irritacin. Consulte con el profesional que lo asiste antes de utilizarlos.   Slo tome medicamentos de Sales promotion account executive o prescriptos para Primary school teacher, las molestias o bajar la fiebre segn las indicaciones de su mdico.   No se haga duchas vaginales ni utilice tampones durante el tratamiento del VPH.   Informe al profesional que lo asiste si cree que est embarazada y tiene VPH.   Hable con el mdico acerca de la administracin de la vacuna de [place] antes de Games developer cualquier actividad sexual.  PREVENCIN  La vacuna de [place] es una nueva vacuna que previene las infecciones de VPH que ocasionan verrugas genitales y cncer del cuello del tero. Se recomienda que se administre la vacuna a mujeres de [place] 9 y 26 aos de edad. No funcionar si ya tiene VPH y no est recomendada para mujeres embarazadas. El diagnstico y Scientist, research (medical) precoces del VPH pueden prevenir el cncer de cuello del tero.   Se realizar un examen de PAP para detectar un cncer de cuello de tero.   El Careers adviser de PAP debe realizarse a los [address].   [place] [address] y los [place], el PAP debe realizarse [address].   Al cumplir los 30 aos, se deber Education officer, environmental un PAP cada 3 aos una vez que ya se ha realizado 3 PAP con resultado normal.   Alguna mujeres tienen problemas mdicos que aumentan la posibilidad de Research officer, political party cervical. Hable con el mdico sobre este problema. Es muy importante que hable con el mdico si aparece un nuevo problema una vez realizado el PAP. En esos casos, el mdico podr recomendarle un control de PAP ms frecuente.   Las recomendaciones mencionadas son las mismas para todas las mujeres, Engineer, manufacturing recibido o no la vacuna contra el VPH (virus del papiloma [place]).   Si se le ha realizado una histerectoma por un problema  que no ha sido cncer o una enfermedad que podra llevarle a Hotel manager, no necesitar realizarse mas PAP.   Si se tiene entre 65 y 4 aos y ha tenido [employer] de PAP durante los 10 aos anteriores, ya no Careers adviser.   Si ha recibido Pharmacist, community anterior por cncer cervical o una enfermedad que podra progresar hacia un cncer, necesitar anlisis de Papanicolau y un control de deteccin de cncer por al menos 20 aos despus del tratamiento.  SOLICITE ANTENCIN MDICA SI:  La piel de la zona tratada se vuelve roja, se hincha o duele.   La temperatura se eleva por encima de 100 F (37,8 C).   Siente un Engineer, maintenance (IT).   Siente bultos o protuberancias tipo granos en la zona genital.   Desarrolla una hemorragia vaginal o en la zona de tratamiento.   Tiene dolor durante las [employer].  Document Released: 03/17/2009  [facility] Patient Information 2011 [place], [place].

## 2011-04-26 NOTE — Assessment & Plan Note (Signed)
All the tests were negative except the wet prep positive for clue cells. Metronidazole treatment seems to have converted this to a yeast infection. So will treat for that.

## 2011-04-29 ENCOUNTER — Ambulatory Visit (INDEPENDENT_AMBULATORY_CARE_PROVIDER_SITE_OTHER): Payer: Self-pay | Admitting: Family Medicine

## 2011-04-29 ENCOUNTER — Encounter: Payer: Self-pay | Admitting: Family Medicine

## 2011-04-29 VITALS — BP 108/68 | HR 90 | Temp 98.3°F | Ht 60.0 in | Wt 128.4 lb

## 2011-04-29 DIAGNOSIS — IMO0002 Reserved for concepts with insufficient information to code with codable children: Secondary | ICD-10-CM

## 2011-04-29 DIAGNOSIS — B977 Papillomavirus as the cause of diseases classified elsewhere: Secondary | ICD-10-CM

## 2011-04-29 DIAGNOSIS — R8789 Other abnormal findings in specimens from female genital organs: Secondary | ICD-10-CM

## 2011-04-29 NOTE — Progress Notes (Signed)
  Subjective:    Patient ID: [name], female    DOB: Sep 03, 1981, 30 y.o.   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  Has been asymptomatic except for recent yeast infection which has resolved  Review of Systems    Pertinent review of systems: negative for fever or unusual weight change. No pelvic pain. Had some vaginal d/c that has resolved with yeast treatment Objective:   Physical Exam GU externally normal Patient given informed consent, signed copy in the chart. Placed in lithotomy position. Cervix viewed with speculum and colposcope. Was the entire squamocolumnar junction seen?yes Any acetowhite lesions noted?no Any abnormalities seen with green filter?no Any abnormalities seen with application of Lugol's solution?no Was the endocervical canal sampled?no Were any cervical biopsies taken?no Were there any complications?no COMMENTS: Patient was given post procedure instructions. .        Assessment & Plan:  Abnormal paps with clinically normal colposcopy. Return to regular screening with first pap in a year.

## 2011-09-07 LAB — CBC
Hemoglobin: 11.9 — ABNORMAL LOW
Platelets: 243
RBC: 3.55 — ABNORMAL LOW
RDW: 13.5
WBC: 19.2 — ABNORMAL HIGH
WBC: 19.9 — ABNORMAL HIGH

## 2011-09-07 LAB — URINALYSIS, ROUTINE W REFLEX MICROSCOPIC
Bilirubin Urine: NEGATIVE
Glucose, UA: NEGATIVE
Nitrite: NEGATIVE
Specific Gravity, Urine: 1.015
pH: 5.5

## 2011-09-07 LAB — RPR: RPR Ser Ql: NONREACTIVE

## 2011-10-11 ENCOUNTER — Encounter: Payer: Self-pay | Admitting: Family Medicine

## 2011-10-11 ENCOUNTER — Ambulatory Visit (INDEPENDENT_AMBULATORY_CARE_PROVIDER_SITE_OTHER): Payer: Self-pay | Admitting: Family Medicine

## 2011-10-11 VITALS — BP 119/74 | HR 83 | Ht 60.0 in | Wt 135.0 lb

## 2011-10-11 DIAGNOSIS — M533 Sacrococcygeal disorders, not elsewhere classified: Secondary | ICD-10-CM | POA: Insufficient documentation

## 2011-10-11 DIAGNOSIS — G8929 Other chronic pain: Secondary | ICD-10-CM | POA: Insufficient documentation

## 2011-10-11 DIAGNOSIS — Z23 Encounter for immunization: Secondary | ICD-10-CM

## 2011-10-11 DIAGNOSIS — I776 Arteritis, unspecified: Secondary | ICD-10-CM

## 2011-10-11 NOTE — Progress Notes (Signed)
  Subjective:    Patient ID: [name], female    DOB: July 11, 1981, 30 y.o.   MRN: [id number]  HPI Two months of intermittent left buttock and left left pain. Aching pain sometimes throbbing. No numbness. Hasn't tried pain reliever. Worse with walking. Better after massage. Sleeps best on her right side.   Intermittent diarrhea past few days  Urethral irritation like a crack that comes and goes.  No vaginal discharge or itching  Review of Systems  Constitutional: Negative for fever, chills and activity change.  Respiratory: Negative for cough.   Cardiovascular: Negative for leg swelling.  Genitourinary: Negative for dysuria, frequency, vaginal discharge, difficulty urinating, genital sores and vaginal pain.  Musculoskeletal: Positive for back pain. Negative for joint swelling and gait problem.       Objective:   Physical Exam She is well-appearing in good spirits Chest clear to auscultation Heart regular rhythm without murmur Abdomen slightly obese soft without masses or tenderness Back no scoliosis or skin changes, minimal left sacroiliac area discomfort with hyperextension Flexion abduction external rotation  and straight leg raising did not cause pain Reflexes normal knees and ankle  good heel and toe stand No pain with cross leg toe-touch and       Assessment & Plan:

## 2011-10-11 NOTE — Patient Instructions (Addendum)
Para dolor de espalda tome Ibuprofen 200 mg 3 tabletas 3 veces al dia con comida para una semana. Si tiene dolor del espalda, cambia a Acetaminophen para dolor. Haga los ejercicios para Hospital doctor.  Regrese si no mejora el dolor.   Hoy va a recibir la vacuna para grippe y tos farina y tetanos.  Return if your back doesn't improve.   Taking over-the-counter Imodium if the stomach cramping and diarrhea persist

## 2011-10-11 NOTE — Assessment & Plan Note (Signed)
Likely relates to picking up her children and her movements at work. We reviewed how to correctly lift with bending her knees Also reviewed abdominal strengthening exercises She is to take ibuprofen rarely for a week with food Switch acetaminophen if she develops abdominal discomfort

## 2012-05-22 ENCOUNTER — Encounter: Payer: Self-pay | Admitting: Family Medicine

## 2012-05-22 ENCOUNTER — Ambulatory Visit (INDEPENDENT_AMBULATORY_CARE_PROVIDER_SITE_OTHER): Payer: Self-pay | Admitting: Family Medicine

## 2012-05-22 ENCOUNTER — Other Ambulatory Visit (HOSPITAL_COMMUNITY)
Admission: RE | Admit: 2012-05-22 | Discharge: 2012-05-22 | Disposition: A | Discharge status: Home / Self Care | Payer: Self-pay | Source: Ambulatory Visit | Attending: Family Medicine | Admitting: Family Medicine

## 2012-05-22 VITALS — BP 108/78 | HR 75 | Ht 60.0 in | Wt 140.0 lb

## 2012-05-22 DIAGNOSIS — Z01419 Encounter for gynecological examination (general) (routine) without abnormal findings: Secondary | ICD-10-CM | POA: Insufficient documentation

## 2012-05-22 DIAGNOSIS — B977 Papillomavirus as the cause of diseases classified elsewhere: Secondary | ICD-10-CM

## 2012-05-22 DIAGNOSIS — Z975 Presence of (intrauterine) contraceptive device: Secondary | ICD-10-CM | POA: Insufficient documentation

## 2012-05-22 DIAGNOSIS — R8789 Other abnormal findings in specimens from female genital organs: Secondary | ICD-10-CM

## 2012-05-22 NOTE — Progress Notes (Signed)
  Subjective:    Patient ID: [name], female    DOB: [birth year], 31 y.o.   MRN: [id number]  HPIInterview conducted in Spanish She is feeling well. No breast problems. Due for pap smear. Has the Mirena which is due to be replaced next year. Menses are scanty    Review of Systems     Objective:   Physical Exam  Constitutional: She appears well-developed and well-nourished.  Neck: No thyromegaly present.  Cardiovascular: Normal rate and regular rhythm.   Pulmonary/Chest: Effort normal and breath sounds normal.       Breasts without masses  Abdominal: Soft. Bowel sounds are normal. There is no tenderness.  Genitourinary: Vagina normal and uterus normal. No vaginal discharge found.       Mirena string visible in os of cervix  Lymphadenopathy:    She has no cervical adenopathy.  Psychiatric: She has a normal mood and affect. Her behavior is normal. Judgment and thought content normal.          Assessment & Plan:

## 2012-05-22 NOTE — Patient Instructions (Addendum)
La examinacion de sus senos y de su matriz es normal. Le llamamos si usted tiene una anormalidad con su papiconolau. Por favor regrese en un ano para replazar su mirena.   Your examination was normal. We will call you if there is any abnormality of your pap smear.   Please return in one year for replacement of your Mirena.

## 2012-05-29 ENCOUNTER — Encounter: Payer: Self-pay | Admitting: Family Medicine

## 2013-05-22 ENCOUNTER — Telehealth: Payer: Self-pay | Admitting: Family Medicine

## 2013-05-22 NOTE — Telephone Encounter (Signed)
I do not have where she applied for the scholarship. [name], [name]

## 2013-05-22 NOTE — Telephone Encounter (Signed)
Pt called in referent to IUD. Pt wants to know if qualified for it ?  [employer]

## 2013-05-23 NOTE — Telephone Encounter (Signed)
Pt stated that she completed the application and gave to [employer]. [employer] )  [employer]

## 2013-05-23 NOTE — Telephone Encounter (Signed)
Please apologize to her but her name is not written down in my book, nor do I have an IUD for her.  She can come back in to complete one if she would like. [name], [name]

## 2013-05-24 NOTE — Telephone Encounter (Signed)
It is exactly what she is planning.   [employer]

## 2013-09-04 ENCOUNTER — Encounter (HOSPITAL_COMMUNITY): Payer: Self-pay | Admitting: *Deleted

## 2013-09-04 ENCOUNTER — Emergency Department (HOSPITAL_COMMUNITY)
Admission: EM | Admit: 2013-09-04 | Discharge: 2013-09-04 | Disposition: A | Discharge status: Home / Self Care | Payer: Self-pay | Attending: Emergency Medicine | Admitting: Emergency Medicine

## 2013-09-04 DIAGNOSIS — J069 Acute upper respiratory infection, unspecified: Secondary | ICD-10-CM | POA: Insufficient documentation

## 2013-09-04 DIAGNOSIS — Z8742 Personal history of other diseases of the female genital tract: Secondary | ICD-10-CM | POA: Insufficient documentation

## 2013-09-04 DIAGNOSIS — J029 Acute pharyngitis, unspecified: Secondary | ICD-10-CM | POA: Insufficient documentation

## 2013-09-04 LAB — CBC WITH DIFFERENTIAL/PLATELET
Basophils Absolute: 0 10*3/uL (ref 0.0–0.1)
HCT: 38.7 % (ref 36.0–46.0)
Hemoglobin: 13.9 g/dL (ref 12.0–15.0)
Lymphocytes Relative: 19 % (ref 12–46)
Lymphs Abs: 1.7 10*3/uL (ref 0.7–4.0)
Monocytes Absolute: 1.1 10*3/uL — ABNORMAL HIGH (ref 0.1–1.0)
Monocytes Relative: 11 % (ref 3–12)
Neutro Abs: 6.3 10*3/uL (ref 1.7–7.7)
RBC: 4.16 MIL/uL (ref 3.87–5.11)
RDW: 12.1 % (ref 11.5–15.5)
WBC: 9.4 10*3/uL (ref 4.0–10.5)

## 2013-09-04 LAB — BASIC METABOLIC PANEL
CO2: 21 mEq/L (ref 19–32)
Chloride: 104 mEq/L (ref 96–112)
Creatinine, Ser: 0.55 mg/dL (ref 0.50–1.10)
Glucose, Bld: 118 mg/dL — ABNORMAL HIGH (ref 70–99)

## 2013-09-04 LAB — RAPID STREP SCREEN (MED CTR MEBANE ONLY): Streptococcus, Group A Screen (Direct): NEGATIVE

## 2013-09-04 MED ORDER — HYDROCODONE-ACETAMINOPHEN 7.5-325 MG/15ML PO SOLN: 15.0000 mL | Freq: Four times a day (QID) | ORAL | Status: DC | PRN

## 2013-09-04 MED ORDER — PSEUDOEPHEDRINE HCL ER 120 MG PO TB12
120.0000 mg | ORAL_TABLET | Freq: Once | ORAL | Status: AC
  Administered 2013-09-04: 120 mg via ORAL
  Filled 2013-09-04: qty 1

## 2013-09-04 MED ORDER — PSEUDOEPHEDRINE HCL ER 120 MG PO TB12: 120.0000 mg | ORAL_TABLET | Freq: Two times a day (BID) | ORAL | Status: DC

## 2013-09-04 NOTE — ED Notes (Signed)
Pt c/o sinus pressure, cough, headache, and sore throat since Friday. Skin warm and dry, respirations equal and unlabored, A&Ox4. Pt speaks spanish with minimal english.

## 2013-09-04 NOTE — ED Provider Notes (Signed)
CSN: [id number]     Arrival date & time 09/04/13  0005 History   First MD Initiated Contact with Patient 09/04/13 0214     Chief Complaint  Patient presents with  . Cough  . Sore Throat   (Consider location/radiation/quality/duration/timing/severity/associated sxs/prior Treatment) HPI Comments: Patient with URI, symptoms, including sinus pressure, cough, headaches, sore throat since, Friday.  Has intermittently taken over-the-counter Tylenol or ibuprofen with little relief.  She reports, that her husband had the same thing, but his symptoms.  Only lasted 2 days, her most concerning complaint tonight is for sore throat  Patient is a 32 y.o. female presenting with cough and pharyngitis. The history is provided by the patient.  Cough Cough characteristics:  Non-productive and hacking Severity:  Mild Onset quality:  Unable to specify Duration:  3 days Timing:  Intermittent Progression:  Unchanged Chronicity:  New Relieved by:  None tried Associated symptoms: myalgias, rhinorrhea, sinus congestion and sore throat   Associated symptoms: no fever, no headaches, no rash and no shortness of breath   Sore Throat Associated symptoms include coughing, myalgias and a sore throat. Pertinent negatives include no fever, headaches or rash.    Past Medical History  Diagnosis Date  . 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 Allergy    History reviewed. No pertinent past surgical history. Family History  Problem Relation Age of Onset  . Diabetes Mother   . Hyperlipidemia Mother   . Hypertension Sister    History  Substance Use Topics  . Smoking status: Never Smoker   . Smokeless tobacco: Never Used  . Alcohol Use: Yes   OB History   Grav Para Term Preterm Abortions TAB SAB Ect Mult Living                 Review of Systems  Constitutional: Negative for fever.  HENT: Positive for sore throat, rhinorrhea and sinus  pressure.   Respiratory: Positive for cough. Negative for shortness of breath.   Musculoskeletal: Positive for myalgias.  Skin: Negative for rash.  Neurological: Negative for headaches.  All other systems reviewed and are negative.    Allergies  Review of patient's allergies indicates no known allergies.  Home Medications   Current Outpatient Rx  Name  Route  Sig  Dispense  Refill  . cetirizine (ZYRTEC) 10 MG tablet   Oral   Take 10 mg by mouth daily.           Marland Kitchen HYDROcodone-acetaminophen (HYCET) 7.5-325 mg/15 ml solution   Oral   Take 15 mLs by mouth 4 (four) times daily as needed for pain.   120 mL   0   . pseudoephedrine (SUDAFED 12 HOUR) 120 MG 12 hr tablet   Oral   Take 1 tablet (120 mg total) by mouth every 12 (twelve) hours.   14 tablet   0    BP 144/94  Pulse 93  Temp(Src) 98.4 F (36.9 C) (Oral)  Resp 16  Wt 151 lb 5 oz (68.635 kg)  BMI 29.55 kg/m2  SpO2 97% Physical Exam  Nursing note and vitals reviewed. Constitutional: She is oriented to person, place, and time. She appears well-developed and well-nourished.  HENT:  Head: Normocephalic.  Right Ear: External ear normal.  Left Ear: External ear normal.  Mouth/Throat: Posterior oropharyngeal erythema present. No posterior oropharyngeal edema.  Eyes: Pupils are equal, round, and reactive to light.  Neck: Normal range of  motion.  Cardiovascular: Normal rate.   Pulmonary/Chest: Effort normal and breath sounds normal. No respiratory distress. She exhibits no tenderness.  Abdominal: Soft.  Musculoskeletal: Normal range of motion.  Lymphadenopathy:    She has no cervical adenopathy.  Neurological: She is alert and oriented to person, place, and time.  Skin: Skin is warm.    ED Course  Procedures (including critical care time) Labs Review Labs Reviewed  CBC WITH DIFFERENTIAL - Abnormal; Notable for the following:    Monocytes Absolute 1.1 (*)    All other components within normal limits  BASIC  METABOLIC PANEL - Abnormal; Notable for the following:    Potassium 3.4 (*)    Glucose, Bld 118 (*)    Calcium 8.3 (*)    All other components within normal limits  RAPID STREP SCREEN  CULTURE, GROUP A STREP   Imaging Review No results found.  MDM   1. URI (upper respiratory infection)   2. Viral pharyngitis     Labs reviewed strep test is negative.  Patient has a viral syndrome.  There is no wheezing, or respiratory, distress.  She's not allergic to any medications.  She has an IUD and is currently taking antibiotics for UTI    Arman Filter, NP 09/04/13 [phone]

## 2013-09-04 NOTE — ED Provider Notes (Signed)
Medical screening examination/treatment/procedure(s) were performed by non-physician practitioner and as supervising physician I was immediately available for consultation/collaboration.  Sunnie Nielsen, MD 09/04/13 [phone]

## 2013-09-04 NOTE — ED Notes (Signed)
Waiting on medication from the pharmacy 

## 2013-09-06 LAB — CULTURE, GROUP A STREP

## 2013-12-27 ENCOUNTER — Emergency Department (HOSPITAL_COMMUNITY)
Admission: EM | Admit: 2013-12-27 | Discharge: 2013-12-27 | Disposition: A | Discharge status: Home / Self Care | Payer: Medicaid Other | Attending: Emergency Medicine | Admitting: Emergency Medicine

## 2013-12-27 ENCOUNTER — Emergency Department (HOSPITAL_COMMUNITY): Payer: Medicaid Other

## 2013-12-27 ENCOUNTER — Encounter (HOSPITAL_COMMUNITY): Payer: Self-pay | Admitting: Emergency Medicine

## 2013-12-27 DIAGNOSIS — K279 Peptic ulcer, site unspecified, unspecified as acute or chronic, without hemorrhage or perforation: Secondary | ICD-10-CM | POA: Insufficient documentation

## 2013-12-27 DIAGNOSIS — R319 Hematuria, unspecified: Secondary | ICD-10-CM

## 2013-12-27 DIAGNOSIS — Z79899 Other long term (current) drug therapy: Secondary | ICD-10-CM | POA: Insufficient documentation

## 2013-12-27 DIAGNOSIS — Z3202 Encounter for pregnancy test, result negative: Secondary | ICD-10-CM | POA: Insufficient documentation

## 2013-12-27 LAB — COMPREHENSIVE METABOLIC PANEL
ALT: 27 U/L (ref 0–35)
AST: 20 U/L (ref 0–37)
Albumin: 3.9 g/dL (ref 3.5–5.2)
Alkaline Phosphatase: 58 U/L (ref 39–117)
BILIRUBIN TOTAL: 0.4 mg/dL (ref 0.3–1.2)
BUN: 13 mg/dL (ref 6–23)
CALCIUM: 8.9 mg/dL (ref 8.4–10.5)
CHLORIDE: 104 meq/L (ref 96–112)
CO2: 23 meq/L (ref 19–32)
CREATININE: 0.68 mg/dL (ref 0.50–1.10)
GLUCOSE: 117 mg/dL — AB (ref 70–99)
Potassium: 4.1 mEq/L (ref 3.7–5.3)
Sodium: 140 mEq/L (ref 137–147)
Total Protein: 7.1 g/dL (ref 6.0–8.3)

## 2013-12-27 LAB — CBC WITH DIFFERENTIAL/PLATELET
BASOS ABS: 0 10*3/uL (ref 0.0–0.1)
Basophils Relative: 0 % (ref 0–1)
EOS PCT: 1 % (ref 0–5)
Eosinophils Absolute: 0.1 10*3/uL (ref 0.0–0.7)
HEMATOCRIT: 39.1 % (ref 36.0–46.0)
HEMOGLOBIN: 13.6 g/dL (ref 12.0–15.0)
LYMPHS ABS: 2 10*3/uL (ref 0.7–4.0)
LYMPHS PCT: 27 % (ref 12–46)
MCH: 33.2 pg (ref 26.0–34.0)
MCHC: 34.8 g/dL (ref 30.0–36.0)
MCV: 95.4 fL (ref 78.0–100.0)
MONO ABS: 0.6 10*3/uL (ref 0.1–1.0)
MONOS PCT: 8 % (ref 3–12)
NEUTROS ABS: 4.7 10*3/uL (ref 1.7–7.7)
Neutrophils Relative %: 64 % (ref 43–77)
Platelets: 240 10*3/uL (ref 150–400)
RBC: 4.1 MIL/uL (ref 3.87–5.11)
RDW: 12.1 % (ref 11.5–15.5)
WBC: 7.4 10*3/uL (ref 4.0–10.5)

## 2013-12-27 LAB — URINE MICROSCOPIC-ADD ON

## 2013-12-27 LAB — URINALYSIS, ROUTINE W REFLEX MICROSCOPIC
BILIRUBIN URINE: NEGATIVE
GLUCOSE, UA: NEGATIVE mg/dL
Ketones, ur: NEGATIVE mg/dL
Leukocytes, UA: NEGATIVE
Nitrite: NEGATIVE
Protein, ur: NEGATIVE mg/dL
SPECIFIC GRAVITY, URINE: 1.03 (ref 1.005–1.030)
UROBILINOGEN UA: 0.2 mg/dL (ref 0.0–1.0)
pH: 5.5 (ref 5.0–8.0)

## 2013-12-27 LAB — LIPASE, BLOOD: Lipase: 34 U/L (ref 11–59)

## 2013-12-27 LAB — PREGNANCY, URINE: PREG TEST UR: NEGATIVE

## 2013-12-27 MED ORDER — HYDROCODONE-ACETAMINOPHEN 5-325 MG PO TABS
1.0000 | ORAL_TABLET | ORAL | Status: DC | PRN
Start: 2013-12-27 — End: 2014-01-14

## 2013-12-27 MED ORDER — FAMOTIDINE 20 MG PO TABS: 20.0000 mg | ORAL_TABLET | Freq: Two times a day (BID) | ORAL | Status: DC

## 2013-12-27 MED ORDER — GI COCKTAIL ~~LOC~~
30.0000 mL | Freq: Once | ORAL | Status: AC
  Administered 2013-12-27: 30 mL via ORAL
  Filled 2013-12-27: qty 30

## 2013-12-27 MED ORDER — PANTOPRAZOLE SODIUM 40 MG IV SOLR: 40.0000 mg | Freq: Once | INTRAVENOUS | Status: DC

## 2013-12-27 MED ORDER — PROMETHAZINE HCL 25 MG PO TABS: 25.0000 mg | ORAL_TABLET | Freq: Four times a day (QID) | ORAL | Status: DC | PRN

## 2013-12-27 NOTE — Discharge Instructions (Signed)
lcera pptica  (Peptic Ulcer)  La lcera pptica es una llaga dolorosa en la membrana que recubre el esfago (lcera esofgica), el estmago (lcera gstrica), o la primera parte del intestino delgado (lcera duodenal). La lcera causa erosin en los tejidos profundos.  CAUSAS  Normalmente, el revestimiento del estmago y del intestino delgado se protegen a s mismos del cido con que se digieren los alimentos. El revestimiento protector puede daarse debido a:   Una infeccin causada por una bacteria llamada Helicobacter pylori (H. pylori).  El uso regular de medicamentos anti-inflamatorios no esteroides (AINE), como el ibuprofeno o la aspirina.  El consumo de tabaco. Otros factores de riesgo incluyen ser mayor de 50 aos, el consumo de alcohol en exceso y Wilburt Finlaytener antecedentes familiares de lcera.  SNTOMAS   Dolor quemante o punzante en la zona entre el pecho y el ombligo.  Acidez.  Nuseas y vmitos.  Hinchazn. El dolor empeora con el estmago vaco y por la noche. Si la lcera sangra, puede causar:   Materia fecal de color negro alquitranado.  Vmito de sangre roja brillante.  Vmito de aspecto similar a la borra del caf. DIAGNSTICO  El diagnstico se realiza basndose en la historia clnica y el examen fsico. Para encontrar las causas de la lcera podrn indicarle otros estudios y procedimientos. Encontrar la causa ayudar a Futures traderdeterminar el mejor tratamiento. Los estudios y procedimientos pueden incluir:   Anlisis de sangre, anlisis de materia fecal, o estudios del aliento para Engineer, manufacturingdetectar la bacteria H. pylori.  Una seriada del tracto gastrointestinal (GI) del esfago, el estmago y el intestino delgado.  Una endoscopia para examinar el esfago, el estmago y el intestino delgado.  Una biopsia. TRATAMIENTO  El tratamiento incluye:   La eliminacin de la causa de la lcera, como el tabaquismo, los [place]INE o el alcohol.  Medicamentos para reducir la cantidad de cido en  el tracto digestivo.  Antibiticos si la causa de la lcera es la bacteria H. pylori.  Una endoscopia superior para tratar Rolan Lipauna lcera sangrante.  Ciruga si el sangrado es grave o si la lcera ha perforado Event organiseralgn lugar en el sistema digestivo. INSTRUCCIONES PARA EL CUIDADO EN EL HOGAR   Evite el tabaco, el alcohol y la cafena. El fumar puede aumentar el cido en el estmago y el tabaquismo continuado no favorecer la curacin de las lceras.  Evite los alimentos y las bebidas que le parece que le causan molestias o que le agravan su lcera.  Tome slo la medicacin que le indic el profesional. No tome sustitutos de venta libre de los medicamentos recetados sin Science writerconsultar a su mdico.  Cumpla con las consultas de control y hgase los estudios segn las indicaciones. SOLICITE ATENCIN MDICA SI:   La infeccin no mejora dentro de los [address]  [address] despus de Programmer, systemsiniciar el tratamiento.  Siente indigestin o [place]acidez de manera continua. SOLICITE ATENCIN MDICA DE INMEDIATO SI:   Siente un dolor repentino y agudo o persistente en el abdomen.  La materia fecal es sanguinolenta o negra, de aspecto alquitranado.  Vomita sangre o el vmito tiene el aspecto similar a la borra del caf.  Si se siente mareado, dbil o que va a desmayarse.  Se siente transpirado o sudoroso. ASEGRESE DE QUE:   Comprende estas instrucciones.  Controlar su enfermedad.  Solicitar ayuda de inmediato si no mejora o si empeora. Document Released: 09/08/2005 Document Revised: 08/23/2012 [facility] Patient Information 2014 [place], [place].  Hematuria en adultos (Hematuria, Adult) La hematuria es la presencia de  sangre en la orina. La causa puede ser una infeccin en la vejiga, en los riones, en la prstata, clculos renales o cncer de las vas urinarias. Normalmente las infecciones pueden tratarse con medicamentos, y los clculos renales, por lo general, se eliminarn a travs de la orina. Si ninguna de estas es la  causa de su hematuria, quizs se necesiten ms estudios para Building services engineer. Es muy importante que le informe a su mdico si ve sangre en la [place], aunque el sangrado se detenga sin tratamiento o no cause dolor. La sangre que aparece en la orina y luego se detiene y vuelve a aparecer nuevamente puede ser un sntoma de una enfermedad muy grave. Adems, el dolor no es un sntoma en las etapas iniciales de muchos tipos de cncer urinarios. INSTRUCCIONES PARA EL CUIDADO EN EL HOGAR   Beba mucho lquido, entre 3 a Risk analyst. Si le han diagnosticado una infeccin, se recomienda especialmente el jugo de arndanos rojos, [address] de grandes cantidades de [place].  Evite la cafena, el t y las bebidas con gas porque suelen irritar la vejiga.  Evite el alcohol ya que puede Teacher, adult education.  Utilice los medicamentos de venta libre o recetados para Primary school teacher, Environmental health practitioner o la fiebre, segn se lo indique el mdico.  Si le han diagnosticado clculos renales, siga las instrucciones de su mdico con respecto a Ecologist la orina para TEFL teacher clculo.  Vace la vejiga con frecuencia. Evite retener la orina durante largos perodos.  Despus de defecar, las mujeres deben higienizarse desde adelante hacia atrs. Use solo un papel tissue por vez.  Si es [employer], vace la vejiga antes y despus de Management consultant. SOLICITE ATENCIN MDICA SI: Tiene dolor en la espalda, fiebre, Dentist estomacal (nuseas) o vmitos, o si los sntomas no mejoran en 3das. Vistelo antes si empeora. SOLICITE ATENCIN MDICA DE INMEDIATO SI:   Tiene fiebre persistente con una temperatura de 101,37F (38,8C) o ms.  Presenta muchos vmitos y no Psychologist, educational.  Siente un dolor intenso en la espalda o el abdomen incluso tomando los medicamentos.  Elimina cogulos grandes o sangre con la [place].  Se siente extremadamente dbil, se desmaya o pierde el conocimiento. ASEGRESE DE  QUE:   Comprende estas instrucciones.  Controlar su afeccin.  Recibir ayuda de inmediato si no mejora o si empeora. Document Released: 11/29/2005 Document Revised: 09/19/2013 [facility] Patient Information 2014 [place], [place].

## 2013-12-27 NOTE — ED Provider Notes (Signed)
CSN: [id number]     Arrival date & time 12/27/13  0520 History   First MD Initiated Contact with Patient 12/27/13 0540     Chief Complaint  Patient presents with  . Abdominal Pain   (Consider location/radiation/quality/duration/timing/severity/associated sxs/prior Treatment) HPI  The patient  is Spanish-speaking; interview done with a phone interpreter.  [name] is a 33 y.o.female without any significant PMH presents to the ER with complaints of abdominal pain that woke her out of her sleep at 3 AM this morning. It persisted until the emergency department where she received a GI cocktail. She describes the pain as very sharp. She says sometimes she feels as though the pain is radiating towards her epigastric region towards her spine and down towards her groin. She has had an episode of vomiting due to the pain. She admits to having 2 episodes like this previously the last couple of days with acute onset of pain, vomiting and then relief. Denies ever having any abdominal surgeries or pains like this in the past. She denies having any other symptoms of fever, chills, diarrhea or constipation, dysuria, hematuria, vaginal discharge or bleeding, any URI symptoms. Patient reports her pain is currently completely resolved.   Past Medical History  Diagnosis Date  . 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 Allergy    History reviewed. No pertinent past surgical history. Family History  Problem Relation Age of Onset  . Diabetes Mother   . Hyperlipidemia Mother   . Hypertension Sister    History  Substance Use Topics  . Smoking status: Never Smoker   . Smokeless tobacco: Never Used  . Alcohol Use: Yes   OB History   Grav Para Term Preterm Abortions TAB SAB Ect Mult Living                 Review of Systems The patient denies anorexia, fever, weight loss,, vision loss, decreased hearing, hoarseness, chest  pain, syncope, dyspnea on exertion, peripheral edema, balance deficits, hemoptysis, abdominal pain, melena, hematochezia, severe indigestion/heartburn, hematuria, incontinence, genital sores, muscle weakness, suspicious skin lesions, transient blindness, difficulty walking, depression, unusual weight change, abnormal bleeding, enlarged lymph nodes, angioedema, and breast masses.  Allergies  Review of patient's allergies indicates no known allergies.  Home Medications   Current Outpatient Rx  Name  Route  Sig  Dispense  Refill  . acetaminophen (TYLENOL) 500 MG tablet   Oral   Take 500 mg by mouth every 6 (six) hours as needed for moderate pain.         . famotidine (PEPCID) 20 MG tablet   Oral   Take 1 tablet (20 mg total) by mouth 2 (two) times daily.   30 tablet   0   . HYDROcodone-acetaminophen (NORCO/VICODIN) 5-325 MG per tablet   Oral   Take 1-2 tablets by mouth every 4 (four) hours as needed.   12 tablet   0   . promethazine (PHENERGAN) 25 MG tablet   Oral   Take 1 tablet (25 mg total) by mouth every 6 (six) hours as needed for nausea or vomiting.   30 tablet   0    BP 119/67  Pulse 68  Temp(Src) 98.5 F (36.9 C) (Oral)  Resp 16  SpO2 99% Physical Exam  Nursing note and vitals reviewed. Constitutional: She appears well-developed and well-nourished. No distress.  HENT:  Head: Normocephalic and atraumatic.  Eyes: Pupils are  equal, round, and reactive to light.  Neck: Normal range of motion. Neck supple.  Cardiovascular: Normal rate and regular rhythm.   Pulmonary/Chest: Effort normal.  Abdominal: Soft. Bowel sounds are normal. She exhibits no distension and no ascites. There is no CVA tenderness.  No tenderness to palpation of the epigastric region or right upper quadrant. Unable to elicit a small amount of tenderness to the suprapubic region.   Neurological: She is alert.  Skin: Skin is warm and dry.    ED Course  Procedures (including critical care  time) Labs Review Labs Reviewed  URINALYSIS, ROUTINE W REFLEX MICROSCOPIC - Abnormal; Notable for the following:    Hgb urine dipstick SMALL (*)    All other components within normal limits  COMPREHENSIVE METABOLIC PANEL - Abnormal; Notable for the following:    Glucose, Bld 117 (*)    All other components within normal limits  URINE MICROSCOPIC-ADD ON - Abnormal; Notable for the following:    Squamous Epithelial / LPF FEW (*)    Crystals CA OXALATE CRYSTALS (*)    All other components within normal limits  PREGNANCY, URINE  CBC WITH DIFFERENTIAL  LIPASE, BLOOD   Imaging Review Ct Abdomen Pelvis Wo Contrast  12/27/2013   CLINICAL DATA:  Right flank pain.  Hematuria.  EXAM: CT ABDOMEN AND PELVIS WITHOUT CONTRAST  TECHNIQUE: Multidetector CT imaging of the abdomen and pelvis was performed following the standard protocol without intravenous contrast.  COMPARISON:  None.  FINDINGS: The lung bases are clear without focal nodule, mass, or airspace disease. The heart size is normal. No significant pleural or pericardial effusion is present.  The liver and spleen are within normal limits. The stomach, duodenum, and pancreas are unremarkable. Common bile duct and gallbladder are within normal limits. The adrenal glands are normal bilaterally. The kidneys and ureters are unremarkable. There is no evidence for stone are obstruction. The urinary bladder is within normal limits.  The rectus sigmoid colon is within normal limits. The remainder the colon is unremarkable. The appendix is visualized and within normal limits. An IUD is in place. The uterus and adnexa are otherwise within normal limits. No significant adenopathy or free fluid is present.  The bone windows are unremarkable.  IMPRESSION: 1. No evidence for nephrolithiasis or urinary obstruction. 2. No acute or focal abnormality in the abdomen or pelvis to explain the patient's symptoms.   Electronically Signed   By: Gennette Pachris  Mattern M.D.   On:  12/27/2013 07:57    EKG Interpretation   None       MDM   1. Hematuria   2. Peptic ulcer      Urinalysis shows hematuria, otherwise the patients laboratory work-up shows no significant findings.  Patient with microsopic hematuria and right flank pain has a normal non contrast CT of the abdomen. Recently passed stone vs peptic ulcer. Since patient is no longer having pain I will treat her for possible peptic ulcer and refer her to gastroenterologist. No stones are found in her kidney but i will ask her to have her PCP recheck her urine within the next month to insure resolution of hematuria.  Rx: pepcid.  32 y.o.[name]'s evaluation in the Emergency Department is complete. It has been determined that no acute conditions requiring further emergency intervention are present at this time. The patient/guardian have been advised of the diagnosis and plan. We have discussed signs and symptoms that warrant return to the ED, such as changes or worsening in symptoms.  Vital signs are stable at discharge. Filed Vitals:   12/27/13 0725  BP: 119/67  Pulse: 68  Temp: 98.5 F (36.9 C)  Resp: 16    Patient/guardian has voiced understanding and agreed to follow-up with the PCP or specialist.    Dorthula Matas, PA-C 12/27/13 [phone]

## 2013-12-27 NOTE — ED Notes (Signed)
Interpreter phone used.

## 2013-12-27 NOTE — ED Notes (Signed)
Pt states that 2 days ago she had the same pain. Pt vomited and took tylenol and she felt better. Pt was asleep and then at 03:00 she felt the pain in her right upper quadrant. Pt denies any problems with urination or bowels, but states that she is nauseated. Pt tried tylenol again with no relief. Pt states that eating has no affect on pain. Pt states pressure or relief of pressure to site of tenderness does not change pain.

## 2013-12-28 NOTE — ED Provider Notes (Signed)
Medical screening examination/treatment/procedure(s) were performed by non-physician practitioner and as supervising physician I was immediately available for consultation/collaboration.  EKG Interpretation   None        Juliet RudeNathan R. Rubin PayorPickering, MD 12/28/13 [phone]

## 2014-01-14 ENCOUNTER — Emergency Department (HOSPITAL_COMMUNITY)
Admission: EM | Admit: 2014-01-14 | Discharge: 2014-01-14 | Disposition: A | Discharge status: Home / Self Care | Payer: Medicaid Other | Attending: Emergency Medicine | Admitting: Emergency Medicine

## 2014-01-14 ENCOUNTER — Emergency Department (HOSPITAL_COMMUNITY): Payer: Medicaid Other

## 2014-01-14 ENCOUNTER — Encounter (HOSPITAL_COMMUNITY): Payer: Self-pay | Admitting: Emergency Medicine

## 2014-01-14 DIAGNOSIS — R109 Unspecified abdominal pain: Secondary | ICD-10-CM

## 2014-01-14 DIAGNOSIS — Z79899 Other long term (current) drug therapy: Secondary | ICD-10-CM | POA: Insufficient documentation

## 2014-01-14 DIAGNOSIS — R1013 Epigastric pain: Secondary | ICD-10-CM | POA: Insufficient documentation

## 2014-01-14 DIAGNOSIS — K8050 Calculus of bile duct without cholangitis or cholecystitis without obstruction: Secondary | ICD-10-CM

## 2014-01-14 DIAGNOSIS — R110 Nausea: Secondary | ICD-10-CM | POA: Insufficient documentation

## 2014-01-14 DIAGNOSIS — K8020 Calculus of gallbladder without cholecystitis without obstruction: Secondary | ICD-10-CM | POA: Insufficient documentation

## 2014-01-14 LAB — CBC WITH DIFFERENTIAL/PLATELET
Basophils Absolute: 0 10*3/uL (ref 0.0–0.1)
Basophils Relative: 0 % (ref 0–1)
Eosinophils Absolute: 0.1 10*3/uL (ref 0.0–0.7)
Eosinophils Relative: 1 % (ref 0–5)
HCT: 38.8 % (ref 36.0–46.0)
Hemoglobin: 13.4 g/dL (ref 12.0–15.0)
Lymphocytes Relative: 37 % (ref 12–46)
Lymphs Abs: 2.1 10*3/uL (ref 0.7–4.0)
MCH: 32.7 pg (ref 26.0–34.0)
MCHC: 34.5 g/dL (ref 30.0–36.0)
MCV: 94.6 fL (ref 78.0–100.0)
Monocytes Absolute: 0.3 10*3/uL (ref 0.1–1.0)
Monocytes Relative: 6 % (ref 3–12)
Neutro Abs: 3.2 10*3/uL (ref 1.7–7.7)
Neutrophils Relative %: 56 % (ref 43–77)
Platelets: 251 10*3/uL (ref 150–400)
RBC: 4.1 MIL/uL (ref 3.87–5.11)
RDW: 11.7 % (ref 11.5–15.5)
WBC: 5.8 10*3/uL (ref 4.0–10.5)

## 2014-01-14 LAB — COMPREHENSIVE METABOLIC PANEL
ALT: 24 U/L (ref 0–35)
AST: 19 U/L (ref 0–37)
Albumin: 4.1 g/dL (ref 3.5–5.2)
Alkaline Phosphatase: 68 U/L (ref 39–117)
BUN: 12 mg/dL (ref 6–23)
CO2: 24 mEq/L (ref 19–32)
Calcium: 9.2 mg/dL (ref 8.4–10.5)
Chloride: 103 mEq/L (ref 96–112)
Creatinine, Ser: 0.55 mg/dL (ref 0.50–1.10)
GFR calc Af Amer: >90 mL/min (ref >90)
GFR calc non Af Amer: >90 mL/min (ref >90)
Glucose, Bld: 92 mg/dL (ref 70–99)
Potassium: 4.1 mEq/L (ref 3.7–5.3)
Sodium: 142 mEq/L (ref 137–147)
Total Bilirubin: 0.2 mg/dL — ABNORMAL LOW (ref 0.3–1.2)
Total Protein: 7.7 g/dL (ref 6.0–8.3)

## 2014-01-14 LAB — LIPASE, BLOOD: Lipase: 44 U/L (ref 11–59)

## 2014-01-14 MED ORDER — GI COCKTAIL ~~LOC~~
30.0000 mL | Freq: Once | ORAL | Status: AC
  Administered 2014-01-14: 30 mL via ORAL
  Filled 2014-01-14: qty 30

## 2014-01-14 MED ORDER — SODIUM CHLORIDE 0.9 % IV BOLUS (SEPSIS)
1000.0000 mL | Freq: Once | INTRAVENOUS | Status: AC
  Administered 2014-01-14: 1000 mL via INTRAVENOUS

## 2014-01-14 MED ORDER — ONDANSETRON HCL 4 MG/2ML IJ SOLN
4.0000 mg | Freq: Once | INTRAMUSCULAR | Status: AC
  Administered 2014-01-14: 4 mg via INTRAVENOUS
  Filled 2014-01-14: qty 2

## 2014-01-14 MED ORDER — OXYCODONE-ACETAMINOPHEN 5-325 MG PO TABS: 1.0000 | ORAL_TABLET | ORAL | Status: DC | PRN

## 2014-01-14 MED ORDER — MORPHINE SULFATE 4 MG/ML IJ SOLN
4.0000 mg | INTRAMUSCULAR | Status: DC | PRN
  Administered 2014-01-14: 4 mg via INTRAVENOUS
  Filled 2014-01-14: qty 1

## 2014-01-14 NOTE — ED Provider Notes (Signed)
CSN: [id number]     Arrival date & time 01/14/14  0906 History   None    Chief Complaint  Patient presents with  . Abdominal Pain   (Consider location/radiation/quality/duration/timing/severity/associated sxs/prior Treatment) HPI  Language barrier. Primarily Spanish-speaking. Interpreter used for history. Prior ED records/labs reviewed.  33 year old female with abdominal pain. Onset around 6:00 this morning. Pain is in the epigastrium and radiates straight through to her back. Has been constant since around 6. No appreciable exacerbating or relieving factors. Describes it as burning. She was seen in the emergency room approximately 2 weeks ago for very similar symptoms. She was prescribed Pepcid which she's been taking twice a day. She was also given when necessary Norco and Phenergan which she tried taking this morning without any significant relief. Since last ER evaluation she describes brief and more mild episodes of similar pain sometimes shortly after eating, but not always. Today the pain was more intense and when it did not let up she came back to the ER. No fevers or chills. Mild nausea, no vomiting. No urinary complaints. No unusual vaginal bleeding or discharge. No shortness of breath.  Past Medical History  Diagnosis Date  . 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 Allergy    History reviewed. No pertinent past surgical history. Family History  Problem Relation Age of Onset  . Diabetes Mother   . Hyperlipidemia Mother   . Hypertension Sister    History  Substance Use Topics  . Smoking status: Never Smoker   . Smokeless tobacco: Never Used  . Alcohol Use: Yes   OB History   Grav Para Term Preterm Abortions TAB SAB Ect Mult Living                 Review of Systems  All systems reviewed and negative, other than as noted in HPI.   Allergies  Review of patient's allergies indicates no known  allergies.  Home Medications   Current Outpatient Rx  Name  Route  Sig  Dispense  Refill  . acetaminophen (TYLENOL) 500 MG tablet   Oral   Take 500 mg by mouth every 6 (six) hours as needed for moderate pain.         . famotidine (PEPCID) 20 MG tablet   Oral   Take 1 tablet (20 mg total) by mouth 2 (two) times daily.   30 tablet   0   . HYDROcodone-acetaminophen (NORCO/VICODIN) 5-325 MG per tablet   Oral   Take 1-2 tablets by mouth every 4 (four) hours as needed.   12 tablet   0   . promethazine (PHENERGAN) 25 MG tablet   Oral   Take 1 tablet (25 mg total) by mouth every 6 (six) hours as needed for nausea or vomiting.   30 tablet   0    BP 152/97  Pulse 84  Temp(Src) 97.8 F (36.6 C) (Oral)  Resp 18  SpO2 100% Physical Exam  Nursing note and vitals reviewed. Constitutional: She appears well-developed and well-nourished. No distress.  Laying in bed. No acute distress.  HENT:  Head: Normocephalic and atraumatic.  Eyes: Conjunctivae are normal. Right eye exhibits no discharge. Left eye exhibits no discharge.  Neck: Neck supple.  Cardiovascular: Normal rate, regular rhythm and normal heart sounds.  Exam reveals no gallop and no friction rub.   No murmur heard. Pulmonary/Chest: Effort normal and breath sounds normal. No respiratory distress.  Abdominal: Soft. She exhibits no distension. There is tenderness.  Perhaps some mild tenderness in epigastrium. No rebound or guarding. No distention.  Genitourinary:  No CVA tenderness.  Musculoskeletal: She exhibits no edema and no tenderness.  Neurological: She is alert.  Skin: Skin is warm and dry.  Psychiatric: She has a normal mood and affect. Her behavior is normal. Thought content normal.    ED Course  Procedures (including critical care time) Labs Review Labs Reviewed  COMPREHENSIVE METABOLIC PANEL - Abnormal; Notable for the following:    Total Bilirubin 0.2 (*)    All other components within normal limits   LIPASE, BLOOD  CBC WITH DIFFERENTIAL   Imaging Review US Abdomen Limited  01/14/2014   CLINICAL DATA:  Right upper quadrant abdominal pain.  EXAM: US ABDOMEN LIMITED - RIGHT UPPER QUADRANT  COMPARISON:  CT ABD/PELV WO CM dated 12/27/2013  FINDINGS: Gallbladder:  There is a 1.9 cm shadowing gallstone within the gallbladder neck (not calcified on CT). This was non mobile, not dislodged with the patient lying prone for 5 min. However, there is no gallbladder wall thickening or pericholecystic fluid. Sonographic Eulah Pont sign is absent.  Common bile duct:  Diameter: 1.5 mm.  No evidence of choledocholithiasis.  Liver:  No focal lesion identified. Within normal limits in parenchymal echogenicity.  IMPRESSION: Large gallstone may be impacted within the gallbladder neck. No objective signs of cholecystitis or biliary dilatation   Electronically Signed   By: Roxy Horseman M.D.   On: 01/14/2014 10:20    EKG Interpretation    Date/Time:  Monday January 14 2014 11:24:56 EST Ventricular Rate:  64 PR Interval:  138 QRS Duration: 135 QT Interval:  447 QTC Calculation: 461 R Axis:   65 Text Interpretation:  Sinus rhythm Right bundle branch block No old tracing to compare Confirmed by Eleshia Wooley  MD, Latamara Melder ([id number]) on 01/14/2014 12:25:06 PM            MDM   1. Biliary colic   2. Abdominal pain     33 year old female with abdominal pain. Fairly benign exam. Consider dyspepsia, pancreatitis, biliary colic. Very atypical for ACS. Will check EKG though. Labs from 2 weeks ago unremarkable. There may be some utility in repeating today. We will additionally obtain an ultrasound of the right upper quadrant to evaluate for possible biliary colic.   10:59 AM Ultrasound significant for a large gallstone which appears to be impacted in the neck. Will place an IV and give a couple doses of pain medication. If truly impacted, will likely be unable to adequately control symptoms. If that is the case, will consult  surgery.  12:23 PM Pain free after a single dose of morphine. Pt would like to go home. I think this is reasonable. LFTs/Lipase normal. Given surgical contact information. Encouraged to make appointment ASAP and not wait until symptoms worsen again. Emergent return precautions discussed. Script for additional PRN pain meds.   Raeford Razor, MD 01/14/14 1225

## 2014-01-14 NOTE — Discharge Instructions (Signed)
Cólico biliar  (Biliary Colic)   El cólico biliar es un dolor continuo o irregular en la zona superior del abdomen. Generalmente se ubica debajo de la zona derecha de la caja torácica. Aparece cuando los cálculos biliares interfieren con el flujo normal de la bilis que proviene de la vesícula. La bilis es un líquido que interviene en la digestión de las grasas. Se produce en el hígado y se almacena en la vesícula. Al comer, La bilis pasa desde la vesícula, a través del conducto cístico y el conducto biliar común al intestino delgado. Allí se mezcla con la comida parcialmente digerida. Si un cálculo obstruye alguno de esos conductos, se detiene el flujo normal de bilis. Las células del conducto biliar se contraen con fuerza para mover el cálculo. Esto causa el dolor del cólico biliar.   SÍNTOMAS  · El paciente se queja de dolor en la zona superior del abdomen. El dolor puede ser:  · En el centro de la zona superior del abdomen, justo por debajo del esternón.  · En la zona superior derecha del abdomen, donde se encuentra la vesícula biliar y el hígado.  · Se expande hacia la espalda, hacia el omóplato derecho.  · Náuseas y vómitos  · El dolor comienza generalmente después de comer.  · El cólico biliar aparece como una demanda de bilis por parte del sistema digestivo. La demanda de bilis es mayor luego de ingerir alimentos ricos en grasas. Los síntomas también pueden aparecer en personas que han estado ayunando e ingieren abruptamente una comida abundante. La mayoría de los episodios de cólico biliar mejoran luego de 1 a 5 horas. Después que se alivia el dolor más intenso, podrá seguir sintiendo un dolor moderado en el abdomen durante un lapso de 24 horas.  DIAGNÓSTICO  Luego de escuchar la descripción de los síntomas, el médico realizará un examen físico. Deberá prestar atención a la zona superior del abdomen. Esta es la zona en la que se encuentra el hígado y la vesícula biliar. El médico podrá observar los cálculos  a través de una ecografía. También le realizaran un escaneo especializado de la vesícula biliar. Le indicarán análisis de sangre, especialmente si tiene fiebre o el dolor persiste.  PREVENCIÓN  El cólico biliar puede evitarse controlando los factores de riesgo que favorecen los cálculos. Algunos de estos factores de riesgo como la herencia, el aumento de la edad y el embarazo son aspectos normales de la vida. La obesidad y una dieta rica en grasas son factores de riesgo que usted puede modificar a través de cambios hacia un estilo de vida saludable. Las mujeres que atraviesan la menopausia y que reciben terapia de reemplazo hormonal (estrógenos) también tienen más riesgo de desarrollar cólicos biliares.  TRATAMIENTO  · Le prescribirán analgésicos.  · Le indicarán una dieta sin grasas.  · Si el primer episodio es intenso, o si los cólicos aparecen nuevamente, generalmente se indica la cirugía para extirpar la vesícula (colecistectomía). Este procedimiento puede realizarse a través de pequeñas incisiones utilizando un instrumento denominado laparoscopio. Muchas veces se requiere una breve estadía en el hospital. Algunas personas reciben el alta el mismo día. Es el tratamiento más ampliamente utilizado en personas que sufren dolor por cálculos biliares. Es efectivo y seguro, no tiene complicaciones en más del 90% de los casos.  · Si la cirugía no puede llevarse a cabo, podrán utilizarse medicamentos para disolver los cálculos. Esta medicación es cara y puede demorar meses o años hasta que tenga efecto. Sólo podrá   disolver cálculos pequeños.  · En algunos casos raros, se combinan estos medicamentos con un procedimiento denominado litotricia por ondas de choque. Este procedimiento utiliza ondas de choque cuidadosamente dirigidas a romper los cálculos. En muchas personas tratadas con este procedimiento, los cálculos vuelven a formarse luego de algunos años.  PRONÓSTICO  Si los cálculos obstruyen el conducto cístico o  conducto biliar común, usted tiene el riesgo de sufrir episodios repetidos de cólicos biliares. También existe un 25% de probabilidades de desarrollar una infección de la vesícula biliar (colecistitisaguda) o alguna otra complicación en los siguientes 10 a 20 años. Si ha sido sometido a una cirugía, prográmela para el momento en que sea conveniente para usted, y para cuando no se encuentre enfermo.  INSTRUCCIONES PARA EL CUIDADO DOMICILIARIO  · Beba gran cantidad de líquidos claros.  · Evite las comidas con mucha grasa o fritas, o cualquier alimento que empeore su dolor.  · Tome los medicamentos como se le indicó.  SOLICITE ATENCIÓN MÉDICA SI:  · Le sube la temperatura a más de 100.5° F (38.1° C).  · El dolor empeora con el tiempo.  · Siente náuseas y esto le impide comer y beber.  · Tiene vómitos.  SOLICITE ATENCIÓN MÉDICA DE INMEDIATO SI:  · Siente dolor continuo e intenso en el abdomen, que no se alivia con medicamentos.  · Siente náuseas y vómitos que no mejoran con medicamentos.  · Tiene síntomas de cólico biliar y comienza a tener fiebre y escalofríos. Esto puede ser un indicio de que ha desarrollado colecistitis. Comuníquese con su médico inmediatamente.  · Su piel o la parte blanca del ojo se vuelven amarillas (ictericia).  Document Released: 03/07/2008 Document Revised: 02/21/2012  ExitCare® Patient Information ©2014 [employer].    Colelitiasis  (Cholelithiasis)  La colelitiasis (también llamada cálculos en la vesícula) es una enfermedad en la que se forman piedras en la vesícula. La vesícula es un órgano que almacena la bilis que se forma en el hígado y que ayuda a digerir grasas. Los cálculos comienzan como pequeños cristales y lentamente se transforman en piedras. El dolor en la vesícula ocurre cuando se producen espasmos y los cálculos obstruyen el conducto. El dolor también se produce cuando una piedra sale por el conducto.   FACTORES DE RIESGO  · Ser mujer.    · Tener embarazos múltiples. Algunas  veces los médicos aconsejan extirpar los cálculos biliares antes de futuros embarazos.    · Ser obeso.  · Dietas que incluyan comidas fritas y grasas.    · Ser mayor de 60 años y el aumento de la edad.    · El uso prolongado de medicamentos que contengan hormonas femeninas.    · Tener diabetes mellitus.    · Pérdida rápida de peso.    · Historia familiar de cálculos (herencia).    SÍNTOMAS  · Náuseas.    · Vómitos.  · Dolor abdominal.    · Piel amarilla (ictericia)    · Dolor súbito. Puede persistir desde algunos minutos hasta algunas horas.  · Fiebre.    · Sensibilidad al tacto.   En algunos casos, cuando los cálculos biliares no se mueven hacia el conducto biliar, las personas no sienten dolor ni presentan síntomas. Estos se denominan cálculos "silenciosos".   TRATAMIENTO  Los cálculos silenciosos no requieren tratamiento. En los casos graves, podrá ser necesaria una cirugía de urgencia. Las opciones de tratamiento son:  · Cirugía para extirpar la vesícula. Es el tratamiento más frecuente.  · Medicamentos. No siempre dan resultado y pueden demorar entre 6 y 12 meses o   más en hacer efecto.  · Tratamiento con ondas de choque (litotricia biliar extracorporal). En este tratamiento, una máquina de ultrasonido envía ondas de choque a la vesícula para destruir los cálculos en pequeños fragmentos que luego podrán pasar a los intestinos o ser disueltas con medicamentos.  INSTRUCCIONES PARA EL CUIDADO EN EL HOGAR   · Sólo tome medicamentos de venta libre o recetados para calmar el dolor, el malestar o bajar la fiebre, según las indicaciones de su médico.    · Siga una dieta baja en grasas hasta que su médico lo vea nuevamente. Las grasas hacen que la vesícula se contraiga, lo que puede producir dolor.    · Concurra a las consultas de control con su médico según las indicaciones. Los ataques casi siempre son recurrentes y generalmente habrá que someterse a una cirugía como tratamiento permanente.    SOLICITE ATENCIÓN MÉDICA  DE INMEDIATO SI:   · El dolor aumenta y no puede controlarlo con los medicamentos.    · Tiene fiebre o síntomas persistentes durante más de 2 - 3 días.    · Tiene fiebre y los síntomas empeoran repentinamente.    · Tiene náuseas o vómitos persistentes.    ASEGÚRESE DE QUE:   · Comprende estas instrucciones.  · Controlará su afección.  · Recibirá ayuda de inmediato si no mejora o si empeora.  Document Released: 09/15/2006 Document Revised: 08/01/2013  ExitCare® Patient Information ©2014 [employer].

## 2014-01-14 NOTE — ED Notes (Signed)
Pt reports abd pain since 0600 this morning, nausea.

## 2014-01-27 ENCOUNTER — Observation Stay (HOSPITAL_COMMUNITY)
Admission: EM | Admit: 2014-01-27 | Discharge: 2014-01-29 | Disposition: A | Discharge status: Home / Self Care | Payer: Medicaid Other | Attending: General Surgery | Admitting: General Surgery

## 2014-01-27 ENCOUNTER — Encounter (HOSPITAL_COMMUNITY): Payer: Self-pay | Admitting: Emergency Medicine

## 2014-01-27 ENCOUNTER — Emergency Department (HOSPITAL_COMMUNITY): Payer: Medicaid Other

## 2014-01-27 DIAGNOSIS — K829 Disease of gallbladder, unspecified: Secondary | ICD-10-CM

## 2014-01-27 DIAGNOSIS — K8000 Calculus of gallbladder with acute cholecystitis without obstruction: Secondary | ICD-10-CM | POA: Insufficient documentation

## 2014-01-27 DIAGNOSIS — K8020 Calculus of gallbladder without cholecystitis without obstruction: Secondary | ICD-10-CM | POA: Diagnosis present

## 2014-01-27 DIAGNOSIS — K8010 Calculus of gallbladder with chronic cholecystitis without obstruction: Principal | ICD-10-CM | POA: Insufficient documentation

## 2014-01-27 LAB — CBC
HCT: 40.1 % (ref 36.0–46.0)
Hemoglobin: 13.9 g/dL (ref 12.0–15.0)
MCH: 32.8 pg (ref 26.0–34.0)
MCHC: 34.7 g/dL (ref 30.0–36.0)
MCV: 94.6 fL (ref 78.0–100.0)
PLATELETS: 258 10*3/uL (ref 150–400)
RBC: 4.24 MIL/uL (ref 3.87–5.11)
RDW: 12 % (ref 11.5–15.5)
WBC: 8.1 10*3/uL (ref 4.0–10.5)

## 2014-01-27 LAB — COMPREHENSIVE METABOLIC PANEL
ALBUMIN: 3.8 g/dL (ref 3.5–5.2)
ALK PHOS: 59 U/L (ref 39–117)
ALT: 28 U/L (ref 0–35)
AST: 20 U/L (ref 0–37)
BUN: 15 mg/dL (ref 6–23)
CHLORIDE: 102 meq/L (ref 96–112)
CO2: 25 mEq/L (ref 19–32)
CREATININE: 0.57 mg/dL (ref 0.50–1.10)
Calcium: 8.8 mg/dL (ref 8.4–10.5)
GFR calc Af Amer: >90 mL/min (ref >90)
GLUCOSE: 111 mg/dL — AB (ref 70–99)
POTASSIUM: 3.9 meq/L (ref 3.7–5.3)
Sodium: 140 mEq/L (ref 137–147)
Total Bilirubin: 0.4 mg/dL (ref 0.3–1.2)
Total Protein: 7.1 g/dL (ref 6.0–8.3)

## 2014-01-27 LAB — CBC WITH DIFFERENTIAL/PLATELET
BASOS PCT: 0 % (ref 0–1)
Basophils Absolute: 0 10*3/uL (ref 0.0–0.1)
Eosinophils Absolute: 0.1 10*3/uL (ref 0.0–0.7)
Eosinophils Relative: 1 % (ref 0–5)
HEMATOCRIT: 38 % (ref 36.0–46.0)
HEMOGLOBIN: 13.1 g/dL (ref 12.0–15.0)
LYMPHS ABS: 2 10*3/uL (ref 0.7–4.0)
LYMPHS PCT: 28 % (ref 12–46)
MCH: 32.8 pg (ref 26.0–34.0)
MCHC: 34.5 g/dL (ref 30.0–36.0)
MCV: 95 fL (ref 78.0–100.0)
MONO ABS: 0.6 10*3/uL (ref 0.1–1.0)
MONOS PCT: 9 % (ref 3–12)
NEUTROS PCT: 62 % (ref 43–77)
Neutro Abs: 4.4 10*3/uL (ref 1.7–7.7)
Platelets: 252 10*3/uL (ref 150–400)
RBC: 4 MIL/uL (ref 3.87–5.11)
RDW: 12 % (ref 11.5–15.5)
WBC: 7.1 10*3/uL (ref 4.0–10.5)

## 2014-01-27 LAB — URINALYSIS, ROUTINE W REFLEX MICROSCOPIC
BILIRUBIN URINE: NEGATIVE
Glucose, UA: NEGATIVE mg/dL
KETONES UR: NEGATIVE mg/dL
Leukocytes, UA: NEGATIVE
NITRITE: NEGATIVE
PROTEIN: NEGATIVE mg/dL
Specific Gravity, Urine: 1.03 (ref 1.005–1.030)
Urobilinogen, UA: 0.2 mg/dL (ref 0.0–1.0)
pH: 6 (ref 5.0–8.0)

## 2014-01-27 LAB — LIPASE, BLOOD: LIPASE: 33 U/L (ref 11–59)

## 2014-01-27 LAB — URINE MICROSCOPIC-ADD ON

## 2014-01-27 LAB — SURGICAL PCR SCREEN
MRSA, PCR: NEGATIVE
Staphylococcus aureus: NEGATIVE

## 2014-01-27 LAB — PREGNANCY, URINE: Preg Test, Ur: NEGATIVE

## 2014-01-27 MED ORDER — GI COCKTAIL ~~LOC~~
30.0000 mL | Freq: Once | ORAL | Status: AC
  Administered 2014-01-27: 30 mL via ORAL
  Filled 2014-01-27: qty 30

## 2014-01-27 MED ORDER — ACETAMINOPHEN 650 MG RE SUPP: 650.0000 mg | Freq: Four times a day (QID) | RECTAL | Status: DC | PRN

## 2014-01-27 MED ORDER — CHLORHEXIDINE GLUCONATE 0.12 % MT SOLN
15.0000 mL | Freq: Two times a day (BID) | OROMUCOSAL | Status: DC
  Administered 2014-01-27 – 2014-01-28 (×2): 15 mL via OROMUCOSAL
  Filled 2014-01-27 (×2): qty 15

## 2014-01-27 MED ORDER — MORPHINE SULFATE 2 MG/ML IJ SOLN
2.0000 mg | INTRAMUSCULAR | Status: DC | PRN
  Administered 2014-01-27: 2 mg via INTRAVENOUS
  Filled 2014-01-27 (×2): qty 1

## 2014-01-27 MED ORDER — ONDANSETRON HCL 4 MG/2ML IJ SOLN: 4.0000 mg | Freq: Four times a day (QID) | INTRAMUSCULAR | Status: DC | PRN

## 2014-01-27 MED ORDER — HYDROCODONE-ACETAMINOPHEN 5-325 MG PO TABS
1.0000 | ORAL_TABLET | ORAL | Status: DC | PRN
  Administered 2014-01-28: 2 via ORAL
  Administered 2014-01-28: 1 via ORAL
  Administered 2014-01-29: 2 via ORAL
  Filled 2014-01-27 (×2): qty 2
  Filled 2014-01-27: qty 1

## 2014-01-27 MED ORDER — MORPHINE SULFATE 4 MG/ML IJ SOLN
4.0000 mg | Freq: Once | INTRAMUSCULAR | Status: AC
  Administered 2014-01-27: 4 mg via INTRAVENOUS
  Filled 2014-01-27: qty 1

## 2014-01-27 MED ORDER — POTASSIUM CHLORIDE IN NACL 20-0.9 MEQ/L-% IV SOLN
INTRAVENOUS | Status: DC
  Administered 2014-01-27 – 2014-01-29 (×3): via INTRAVENOUS
  Filled 2014-01-27 (×6): qty 1000

## 2014-01-27 MED ORDER — ACETAMINOPHEN 325 MG PO TABS
650.0000 mg | ORAL_TABLET | Freq: Four times a day (QID) | ORAL | Status: DC | PRN
  Administered 2014-01-27: 650 mg via ORAL
  Filled 2014-01-27: qty 2

## 2014-01-27 MED ORDER — PIPERACILLIN-TAZOBACTAM 3.375 G IVPB 30 MIN
3.3750 g | Freq: Four times a day (QID) | INTRAVENOUS | Status: DC
  Administered 2014-01-27: 3.375 g via INTRAVENOUS
  Filled 2014-01-27 (×3): qty 50

## 2014-01-27 MED ORDER — PANTOPRAZOLE SODIUM 40 MG IV SOLR
40.0000 mg | Freq: Once | INTRAVENOUS | Status: AC
  Administered 2014-01-27: 40 mg via INTRAVENOUS
  Filled 2014-01-27: qty 40

## 2014-01-27 MED ORDER — PIPERACILLIN-TAZOBACTAM 3.375 G IVPB 30 MIN
3.3750 g | Freq: Four times a day (QID) | INTRAVENOUS | Status: DC
  Administered 2014-01-28: 3.375 g via INTRAVENOUS
  Filled 2014-01-27 (×2): qty 50

## 2014-01-27 MED ORDER — INFLUENZA VAC SPLIT QUAD 0.5 ML IM SUSP
0.5000 mL | INTRAMUSCULAR | Status: DC
  Filled 2014-01-27: qty 0.5

## 2014-01-27 MED ORDER — HYDROMORPHONE HCL PF 1 MG/ML IJ SOLN
1.0000 mg | Freq: Once | INTRAMUSCULAR | Status: AC
Start: 2014-01-27 — End: 2014-01-27
  Administered 2014-01-27: 1 mg via INTRAVENOUS
  Filled 2014-01-27: qty 1

## 2014-01-27 MED ORDER — ONDANSETRON 4 MG PO TBDP
8.0000 mg | ORAL_TABLET | Freq: Once | ORAL | Status: AC
  Administered 2014-01-27: 8 mg via ORAL
  Filled 2014-01-27: qty 2

## 2014-01-27 MED ORDER — ENOXAPARIN SODIUM 40 MG/0.4ML ~~LOC~~ SOLN
40.0000 mg | SUBCUTANEOUS | Status: DC
  Administered 2014-01-27: 40 mg via SUBCUTANEOUS
  Filled 2014-01-27 (×3): qty 0.4

## 2014-01-27 MED ORDER — BIOTENE DRY MOUTH MT LIQD
15.0000 mL | Freq: Two times a day (BID) | OROMUCOSAL | Status: DC
  Administered 2014-01-28 (×2): 15 mL via OROMUCOSAL

## 2014-01-27 NOTE — ED Provider Notes (Signed)
Medical screening examination/treatment/procedure(s) were conducted as a shared visit with non-physician practitioner(s) and myself.  I personally evaluated the patient during the encounter.  Pt comes in with cc of abd pain. Hx of biliary colic. Pain is in the epigastrium and left sided, and started last night.  No uti like sx. Pain is not associated with food intake. Pt has neg murphy's, no peritoneal signs. Pain different than her biliary colic. Asked Gerome ApleyKeren S. To get basic labs, reassess for possible imaging, and po challenge the patient.  Derwood KaplanAnkit Caison Hearn, MD 01/27/14 2316

## 2014-01-27 NOTE — ED Notes (Signed)
Attempted to call report x 1  

## 2014-01-27 NOTE — ED Provider Notes (Signed)
CSN: [id number]     Arrival date & time 01/27/14  0447 History   First MD Initiated Contact with Patient 01/27/14 [phone]     Chief Complaint  Patient presents with  . Abdominal Pain     (Consider location/radiation/quality/duration/timing/severity/associated sxs/prior Treatment) Patient is a 33 y.o. female presenting with abdominal pain. The history is provided by the patient. No language interpreter was used.  Abdominal Pain Pain location:  Generalized Pain quality: aching and stabbing   Pain radiates to:  Does not radiate Duration:  2 hours Timing:  Constant Progression:  Worsening Chronicity:  New Relieved by:  Nothing Worsened by:  Nothing tried Ineffective treatments:  None tried Associated symptoms: diarrhea and vomiting   Associated symptoms: no shortness of breath   Risk factors: pregnancy   Risk factors: no alcohol abuse and has not had multiple surgeries   Pt has a history of a galstone  Past Medical History  Diagnosis Date  . 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 Allergy    History reviewed. No pertinent past surgical history. Family History  Problem Relation Age of Onset  . Diabetes Mother   . Hyperlipidemia Mother   . Hypertension Sister    History  Substance Use Topics  . Smoking status: Never Smoker   . Smokeless tobacco: Never Used  . Alcohol Use: Yes   OB History   Grav Para Term Preterm Abortions TAB SAB Ect Mult Living                 Review of Systems  Respiratory: Negative for shortness of breath.   Gastrointestinal: Positive for vomiting, abdominal pain and diarrhea.  All other systems reviewed and are negative.      Allergies  Tape  Home Medications   Current Outpatient Rx  Name  Route  Sig  Dispense  Refill  . oxyCODONE-acetaminophen (PERCOCET/ROXICET) 5-325 MG per tablet   Oral   Take 1-2 tablets by mouth every 4 (four) hours as needed for severe pain.   20 tablet   0    BP 119/66  Pulse 68  Temp(Src) 97.8 F (36.6 C) (Oral)  Resp 18  SpO2 99% Physical Exam  Nursing note and vitals reviewed. Constitutional: She is oriented to person, place, and time. She appears well-developed and well-nourished.  HENT:  Head: Normocephalic and atraumatic.  Eyes: Conjunctivae and EOM are normal. Pupils are equal, round, and reactive to light.  Neck: Normal range of motion. Neck supple.  Cardiovascular: Normal rate, regular rhythm and normal heart sounds.   Pulmonary/Chest: Effort normal and breath sounds normal.  Abdominal: Soft. She exhibits no distension. There is tenderness.  Musculoskeletal: Normal range of motion.  Neurological: She is alert and oriented to person, place, and time.  Skin: Skin is warm.  Psychiatric: She has a normal mood and affect.    ED Course  Procedures (including critical care time) Labs Review Labs Reviewed  COMPREHENSIVE METABOLIC PANEL - Abnormal; Notable for the following:    Glucose, Bld 111 (*)    All other components within normal limits  URINALYSIS, ROUTINE W REFLEX MICROSCOPIC - Abnormal; Notable for the following:    APPearance CLOUDY (*)    Hgb urine dipstick TRACE (*)    All other components within normal limits  URINE MICROSCOPIC-ADD ON - Abnormal; Notable for the following:    Squamous Epithelial / LPF FEW (*)    Bacteria, UA FEW (*)  All other components within normal limits  CBC WITH DIFFERENTIAL  LIPASE, BLOOD  PREGNANCY, URINE   Imaging Review No results found.  EKG Interpretation   None     Ultrasound on 2/2 showed large gallstone.   Pt has been unable to see surgeon due to cost  MDM   Final diagnoses:  Gallbladder disease    I spoke to Dr. Derrell Lollingramirez who will see here.  He request ultrasound     Kathy AreasLeslie K Sofia, PA-C 01/27/14 1008

## 2014-01-27 NOTE — ED Notes (Signed)
Pt arrives with husband who is translating. States that she has been having abd pain for the past 2 hours. States that she feels nauseous but cannot vomit. Denies diarrhea. Pt has discharge papers from Feb 2 diagnosing her with biliary colic and [facility]. Pt states that she called to follow up and was told that she would need to pay 50-60% and that she cannot do that and so she has not been to their office.

## 2014-01-27 NOTE — ED Notes (Signed)
Surgery at bedside.

## 2014-01-27 NOTE — H&P (Signed)
Chief Complaint: abdominal pain   HPI: [name] is a healthy Hispanic speaking female who presents with recurrent abdominal pain.  She was seen in the ED on 01/14/14 with like symptoms.  She reports that she has been told in the past she needs a cholecystectomy, however, has not had the financial means to have the surgery.  This episode of pain began this morning at 3AM.  Location is in the epigastric region, LUQ with radiation to the back.  She reports nausea, no vomiting, fever, chills or sweats.  She reports frequent bouts of abdominal pain with meals.  Her symptoms are characterized as sharp, burning and cramping.  Time pattern is constant.  She denies any modifying, aggravating or alleviating factors.  She has not had anything to eat or drink since 7:30PM last night.  She denies previous surgeries, no other medical problems.  Today she has a normal white count, normal lipase, LFTs.  [place] of abdomen completed on 01/14/14 shows a large gallstone at the neck of the gallbladder.    Past Medical History  Diagnosis Date  . 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 Allergy     History reviewed. No pertinent past surgical history.  Family History  Problem Relation Age of Onset  . Diabetes Mother   . Hyperlipidemia Mother   . Hypertension Sister    Social History:  reports that she has never smoked. She has never used smokeless tobacco. She reports that she drinks alcohol. She reports that she does not use illicit drugs.  Allergies:  Allergies  Allergen Reactions  . Tape Rash   Medication: Scheduled Meds: . enoxaparin (LOVENOX) injection  40 mg Subcutaneous Q24H   Continuous Infusions: . 0.9 % NaCl with KCl 20 mEq / L     PRN Meds:.acetaminophen, acetaminophen, HYDROcodone-acetaminophen, morphine injection, ondansetron  (Not in a hospital admission)  Results for orders placed during the hospital encounter  of 01/27/14 (from the past 48 hour(s))  URINALYSIS, ROUTINE W REFLEX MICROSCOPIC     Status: Abnormal   Collection Time    01/27/14  5:03 AM      Result Value Ref Range   Color, Urine YELLOW  YELLOW   APPearance CLOUDY (*) CLEAR   Specific Gravity, Urine 1.030  1.005 - 1.030   pH 6.0  5.0 - 8.0   Glucose, UA NEGATIVE  NEGATIVE mg/dL   Hgb urine dipstick TRACE (*) NEGATIVE   Bilirubin Urine NEGATIVE  NEGATIVE   Ketones, ur NEGATIVE  NEGATIVE mg/dL   Protein, ur NEGATIVE  NEGATIVE mg/dL   Urobilinogen, UA 0.2  0.0 - 1.0 mg/dL   Nitrite NEGATIVE  NEGATIVE   Leukocytes, UA NEGATIVE  NEGATIVE  URINE MICROSCOPIC-ADD ON     Status: Abnormal   Collection Time    01/27/14  5:03 AM      Result Value Ref Range   Squamous Epithelial / LPF FEW (*) RARE   WBC, UA 0-2  <3 WBC/hpf   RBC / HPF 0-2  <3 RBC/hpf   Bacteria, UA FEW (*) RARE   Urine-Other MUCOUS PRESENT     Comment: AMORPHOUS URATES/PHOSPHATES  PREGNANCY, URINE     Status: None   Collection Time    01/27/14  5:03 AM      Result Value Ref Range   Preg Test, Ur NEGATIVE  NEGATIVE   Comment:  THE SENSITIVITY OF THIS     METHODOLOGY IS >20 mIU/mL.  CBC WITH DIFFERENTIAL     Status: None   Collection Time    01/27/14  5:42 AM      Result Value Ref Range   WBC 7.1  4.0 - 10.5 K/uL   RBC 4.00  3.87 - 5.11 MIL/uL   Hemoglobin 13.1  12.0 - 15.0 g/dL   HCT 38.0  36.0 - 46.0 %   MCV 95.0  78.0 - 100.0 fL   MCH 32.8  26.0 - 34.0 pg   MCHC 34.5  30.0 - 36.0 g/dL   RDW 12.0  11.5 - 15.5 %   Platelets 252  150 - 400 K/uL   Neutrophils Relative % 62  43 - 77 %   Neutro Abs 4.4  1.7 - 7.7 K/uL   Lymphocytes Relative 28  12 - 46 %   Lymphs Abs 2.0  0.7 - 4.0 K/uL   Monocytes Relative 9  3 - 12 %   Monocytes Absolute 0.6  0.1 - 1.0 K/uL   Eosinophils Relative 1  0 - 5 %   Eosinophils Absolute 0.1  0.0 - 0.7 K/uL   Basophils Relative 0  0 - 1 %   Basophils Absolute 0.0  0.0 - 0.1 K/uL  COMPREHENSIVE METABOLIC PANEL      Status: Abnormal   Collection Time    01/27/14  5:42 AM      Result Value Ref Range   Sodium 140  137 - 147 mEq/L   Potassium 3.9  3.7 - 5.3 mEq/L   Chloride 102  96 - 112 mEq/L   CO2 25  19 - 32 mEq/L   Glucose, Bld 111 (*) 70 - 99 mg/dL   BUN 15  6 - 23 mg/dL   Creatinine, Ser 0.57  0.50 - 1.10 mg/dL   Calcium 8.8  8.4 - 10.5 mg/dL   Total Protein 7.1  6.0 - 8.3 g/dL   Albumin 3.8  3.5 - 5.2 g/dL   AST 20  0 - 37 U/L   ALT 28  0 - 35 U/L   Alkaline Phosphatase 59  39 - 117 U/L   Total Bilirubin 0.4  0.3 - 1.2 mg/dL   GFR calc non Af Amer >90  >90 mL/min   GFR calc Af Amer >90  >90 mL/min   Comment: (NOTE)     The eGFR has been calculated using the CKD EPI equation.     This calculation has not been validated in all clinical situations.     eGFR's persistently <90 mL/min signify possible Chronic Kidney     Disease.  LIPASE, BLOOD     Status: None   Collection Time    01/27/14  5:42 AM      Result Value Ref Range   Lipase 33  11 - 59 U/L   No results found.  Review of Systems  All other systems reviewed and are negative.    Blood pressure 119/61, pulse 60, temperature 98.3 F (36.8 C), temperature source Oral, resp. rate 18, SpO2 99.00%. Physical Exam  Constitutional: She is oriented to person, place, and time. She appears well-developed and well-nourished. No distress.  HENT:  Head: Normocephalic and atraumatic.  Mouth/Throat: No oropharyngeal exudate.  Neck: Normal range of motion. Neck supple.  Cardiovascular: Normal rate and regular rhythm.  Exam reveals no gallop and no friction rub.   No murmur heard. Respiratory: Effort normal and breath sounds normal. No  respiratory distress. She has no wheezes. She has no rales. She exhibits no tenderness.  GI: Soft. Bowel sounds are normal. She exhibits no distension and no mass. There is no rebound and no guarding.  TTP epigastric region, no rebound tenderness, negative murphy's sign  Musculoskeletal: She exhibits no  edema and no tenderness. - Lymphadenopathy:    She has no cervical adenopathy.  Neurological: She is alert and oriented to person, place, and time.  Skin: Skin is dry. No rash noted. She is not diaphoretic. No erythema. No pallor.  Psychiatric: She has a normal mood and affect. Her behavior is normal. Thought content normal.     Assessment/Plan Symptomatic cholelithiasis -admit to observation -plan for a laparoscopic cholecystectomy tomorrow morning. -may have clears if able to tolerate, NPO after midnight -pain control, anti-emetics -? Does she need empiric antibiotics. -VTE prophylaxis; lovenox, SCDs  Ersel Enslin ANP-BC 01/27/2014, 11:30 AM

## 2014-01-27 NOTE — ED Notes (Signed)
In with Kathy SchaumannLeslie Harrell to access pt using interpreter line, pt sts same as earlier but that she was told by surgeon that she could f/u back here to have surgery when she was worse.

## 2014-01-27 NOTE — H&P (Signed)
I have seen and examined the pt and agree with PA-Riebock's progress note. Acute cholecystitis [facility] OR in AM for Lap chole OK for clear liq diet, NPO after MN Abx

## 2014-01-27 NOTE — ED Notes (Signed)
1st contact with pt. Pt resting but sts that she needs to go to bathroom. Pt ambulatory to br. Nadn. Nothing needed. sts pain doing better.

## 2014-01-28 ENCOUNTER — Encounter (HOSPITAL_COMMUNITY): Payer: Medicaid Other | Admitting: Anesthesiology

## 2014-01-28 ENCOUNTER — Encounter (HOSPITAL_COMMUNITY): Payer: Self-pay | Admitting: Anesthesiology

## 2014-01-28 ENCOUNTER — Observation Stay (HOSPITAL_COMMUNITY): Payer: Medicaid Other | Admitting: Anesthesiology

## 2014-01-28 ENCOUNTER — Encounter (HOSPITAL_COMMUNITY)
Admission: EM | Disposition: A | Discharge status: Home / Self Care | Payer: Self-pay | Source: Home / Self Care | Attending: Emergency Medicine

## 2014-01-28 ENCOUNTER — Observation Stay (HOSPITAL_COMMUNITY): Payer: Medicaid Other

## 2014-01-28 DIAGNOSIS — K8010 Calculus of gallbladder with chronic cholecystitis without obstruction: Secondary | ICD-10-CM

## 2014-01-28 HISTORY — PX: CHOLECYSTECTOMY: SHX55

## 2014-01-28 LAB — COMPREHENSIVE METABOLIC PANEL
ALT: 21 U/L (ref 0–35)
AST: 18 U/L (ref 0–37)
Albumin: 3.3 g/dL — ABNORMAL LOW (ref 3.5–5.2)
Alkaline Phosphatase: 47 U/L (ref 39–117)
BUN: 8 mg/dL (ref 6–23)
CALCIUM: 8.3 mg/dL — AB (ref 8.4–10.5)
CO2: 24 meq/L (ref 19–32)
CREATININE: 0.57 mg/dL (ref 0.50–1.10)
Chloride: 103 mEq/L (ref 96–112)
GFR calc Af Amer: >90 mL/min (ref >90)
GFR calc non Af Amer: >90 mL/min (ref >90)
Glucose, Bld: 97 mg/dL (ref 70–99)
Potassium: 4 mEq/L (ref 3.7–5.3)
SODIUM: 136 meq/L — AB (ref 137–147)
TOTAL PROTEIN: 6.4 g/dL (ref 6.0–8.3)
Total Bilirubin: 0.6 mg/dL (ref 0.3–1.2)

## 2014-01-28 LAB — CBC
HEMATOCRIT: 36.1 % (ref 36.0–46.0)
HEMOGLOBIN: 12.5 g/dL (ref 12.0–15.0)
MCH: 33.2 pg (ref 26.0–34.0)
MCHC: 34.6 g/dL (ref 30.0–36.0)
MCV: 95.8 fL (ref 78.0–100.0)
Platelets: 214 10*3/uL (ref 150–400)
RBC: 3.77 MIL/uL — ABNORMAL LOW (ref 3.87–5.11)
RDW: 12.1 % (ref 11.5–15.5)
WBC: 5.6 10*3/uL (ref 4.0–10.5)

## 2014-01-28 SURGERY — LAPAROSCOPIC CHOLECYSTECTOMY WITH INTRAOPERATIVE CHOLANGIOGRAM
Anesthesia: General | Site: Abdomen

## 2014-01-28 MED ORDER — ROCURONIUM BROMIDE 100 MG/10ML IV SOLN
INTRAVENOUS | Status: DC | PRN
  Administered 2014-01-28: 40 mg via INTRAVENOUS

## 2014-01-28 MED ORDER — KETOROLAC TROMETHAMINE 30 MG/ML IJ SOLN
30.0000 mg | Freq: Four times a day (QID) | INTRAMUSCULAR | Status: DC
Start: 2014-01-28 — End: 2014-01-29
  Administered 2014-01-28 – 2014-01-29 (×5): 30 mg via INTRAVENOUS
  Filled 2014-01-28 (×6): qty 1

## 2014-01-28 MED ORDER — 0.9 % SODIUM CHLORIDE (POUR BTL) OPTIME
TOPICAL | Status: DC | PRN
  Administered 2014-01-28: 1000 mL

## 2014-01-28 MED ORDER — LIDOCAINE HCL (CARDIAC) 20 MG/ML IV SOLN
INTRAVENOUS | Status: AC
  Filled 2014-01-28: qty 5

## 2014-01-28 MED ORDER — OXYCODONE HCL 5 MG/5ML PO SOLN: 5.0000 mg | Freq: Once | ORAL | Status: DC | PRN

## 2014-01-28 MED ORDER — HYDROMORPHONE HCL PF 1 MG/ML IJ SOLN
0.2500 mg | INTRAMUSCULAR | Status: DC | PRN
  Administered 2014-01-28 (×3): 0.5 mg via INTRAVENOUS

## 2014-01-28 MED ORDER — GLYCOPYRROLATE 0.2 MG/ML IJ SOLN
INTRAMUSCULAR | Status: DC | PRN
  Administered 2014-01-28: 0.4 mg via INTRAVENOUS

## 2014-01-28 MED ORDER — ONDANSETRON HCL 4 MG/2ML IJ SOLN
INTRAMUSCULAR | Status: AC
  Filled 2014-01-28: qty 2

## 2014-01-28 MED ORDER — ONDANSETRON HCL 4 MG/2ML IJ SOLN
INTRAMUSCULAR | Status: DC | PRN
  Administered 2014-01-28: 4 mg via INTRAVENOUS

## 2014-01-28 MED ORDER — SODIUM CHLORIDE 0.9 % IR SOLN
Status: DC | PRN
  Administered 2014-01-28: 1000 mL

## 2014-01-28 MED ORDER — MIDAZOLAM HCL 2 MG/2ML IJ SOLN
INTRAMUSCULAR | Status: AC
  Filled 2014-01-28: qty 2

## 2014-01-28 MED ORDER — PROPOFOL 10 MG/ML IV BOLUS
INTRAVENOUS | Status: AC
  Filled 2014-01-28: qty 20

## 2014-01-28 MED ORDER — FENTANYL CITRATE 0.05 MG/ML IJ SOLN
INTRAMUSCULAR | Status: DC | PRN
  Administered 2014-01-28: 150 ug via INTRAVENOUS

## 2014-01-28 MED ORDER — KETOROLAC TROMETHAMINE 30 MG/ML IJ SOLN
INTRAMUSCULAR | Status: DC | PRN
  Administered 2014-01-28: 30 mg via INTRAVENOUS

## 2014-01-28 MED ORDER — KETOROLAC TROMETHAMINE 30 MG/ML IJ SOLN
INTRAMUSCULAR | Status: AC
  Filled 2014-01-28: qty 1

## 2014-01-28 MED ORDER — ENOXAPARIN SODIUM 40 MG/0.4ML ~~LOC~~ SOLN
40.0000 mg | SUBCUTANEOUS | Status: DC
  Administered 2014-01-28: 40 mg via SUBCUTANEOUS
  Filled 2014-01-28 (×2): qty 0.4

## 2014-01-28 MED ORDER — MORPHINE SULFATE 2 MG/ML IJ SOLN
1.0000 mg | INTRAMUSCULAR | Status: DC | PRN
  Administered 2014-01-28: 2 mg via INTRAVENOUS
  Administered 2014-01-28: 4 mg via INTRAVENOUS
  Filled 2014-01-28: qty 2

## 2014-01-28 MED ORDER — LIDOCAINE HCL (CARDIAC) 20 MG/ML IV SOLN
INTRAVENOUS | Status: DC | PRN
  Administered 2014-01-28: 100 mg via INTRAVENOUS

## 2014-01-28 MED ORDER — NEOSTIGMINE METHYLSULFATE 1 MG/ML IJ SOLN
INTRAMUSCULAR | Status: DC | PRN
  Administered 2014-01-28: 3 mg via INTRAVENOUS

## 2014-01-28 MED ORDER — HYDROMORPHONE HCL PF 1 MG/ML IJ SOLN
INTRAMUSCULAR | Status: AC
  Filled 2014-01-28: qty 1

## 2014-01-28 MED ORDER — LACTATED RINGERS IV SOLN
INTRAVENOUS | Status: DC
  Administered 2014-01-28: 09:00:00 via INTRAVENOUS

## 2014-01-28 MED ORDER — METOCLOPRAMIDE HCL 5 MG/ML IJ SOLN: 10.0000 mg | Freq: Once | INTRAMUSCULAR | Status: DC | PRN

## 2014-01-28 MED ORDER — BUPIVACAINE-EPINEPHRINE (PF) 0.25% -1:200000 IJ SOLN
INTRAMUSCULAR | Status: AC
  Filled 2014-01-28: qty 30

## 2014-01-28 MED ORDER — OXYCODONE HCL 5 MG PO TABS: 5.0000 mg | ORAL_TABLET | Freq: Once | ORAL | Status: DC | PRN

## 2014-01-28 MED ORDER — BUPIVACAINE-EPINEPHRINE 0.25% -1:200000 IJ SOLN
INTRAMUSCULAR | Status: DC | PRN
  Administered 2014-01-28: 24 mL

## 2014-01-28 MED ORDER — NEOSTIGMINE METHYLSULFATE 1 MG/ML IJ SOLN
INTRAMUSCULAR | Status: AC
  Filled 2014-01-28: qty 10

## 2014-01-28 MED ORDER — PROPOFOL 10 MG/ML IV BOLUS
INTRAVENOUS | Status: DC | PRN
  Administered 2014-01-28: 200 mg via INTRAVENOUS

## 2014-01-28 MED ORDER — FENTANYL CITRATE 0.05 MG/ML IJ SOLN
INTRAMUSCULAR | Status: AC
  Filled 2014-01-28: qty 5

## 2014-01-28 MED ORDER — HYDROMORPHONE HCL PF 1 MG/ML IJ SOLN
INTRAMUSCULAR | Status: AC
  Administered 2014-01-28: 0.5 mg via INTRAVENOUS
  Filled 2014-01-28: qty 1

## 2014-01-28 MED ORDER — MIDAZOLAM HCL 5 MG/5ML IJ SOLN
INTRAMUSCULAR | Status: DC | PRN
  Administered 2014-01-28: 2 mg via INTRAVENOUS

## 2014-01-28 MED ORDER — SODIUM CHLORIDE 0.9 % IV SOLN
INTRAVENOUS | Status: DC | PRN
  Administered 2014-01-28: 10:00:00

## 2014-01-28 MED ORDER — GLYCOPYRROLATE 0.2 MG/ML IJ SOLN
INTRAMUSCULAR | Status: AC
  Filled 2014-01-28: qty 2

## 2014-01-28 SURGICAL SUPPLY — 47 items
ADH SKN CLS APL DERMABOND .7 (GAUZE/BANDAGES/DRESSINGS) ×1
APL SKNCLS STERI-STRIP NONHPOA (GAUZE/BANDAGES/DRESSINGS) ×1
APPLIER CLIP 5 13 M/L LIGAMAX5 (MISCELLANEOUS) ×3
APR CLP MED LRG 5 ANG JAW (MISCELLANEOUS) ×1
BAG SPEC RTRVL LRG 6X4 10 (ENDOMECHANICALS) ×1
BANDAGE ADHESIVE 1X3 (GAUZE/BANDAGES/DRESSINGS) ×9 IMPLANT
BENZOIN TINCTURE PRP APPL 2/3 (GAUZE/BANDAGES/DRESSINGS) ×3 IMPLANT
CANISTER SUCTION 2500CC (MISCELLANEOUS) ×3 IMPLANT
CHLORAPREP W/TINT 26ML (MISCELLANEOUS) ×3 IMPLANT
CLIP APPLIE 5 13 M/L LIGAMAX5 (MISCELLANEOUS) ×1 IMPLANT
COVER MAYO STAND STRL (DRAPES) ×3 IMPLANT
COVER SURGICAL LIGHT HANDLE (MISCELLANEOUS) ×3 IMPLANT
DECANTER SPIKE VIAL GLASS SM (MISCELLANEOUS) ×3 IMPLANT
DERMABOND ADVANCED (GAUZE/BANDAGES/DRESSINGS) ×2
DERMABOND ADVANCED .7 DNX12 (GAUZE/BANDAGES/DRESSINGS) IMPLANT
DRAPE C-ARM 42X72 X-RAY (DRAPES) ×3 IMPLANT
DRAPE UTILITY 15X26 W/TAPE STR (DRAPE) ×6 IMPLANT
DRSG TEGADERM 4X4.75 (GAUZE/BANDAGES/DRESSINGS) ×3 IMPLANT
ELECT REM PT RETURN 9FT ADLT (ELECTROSURGICAL) ×3
ELECTRODE REM PT RTRN 9FT ADLT (ELECTROSURGICAL) ×1 IMPLANT
GAUZE SPONGE 2X2 8PLY STRL LF (GAUZE/BANDAGES/DRESSINGS) ×1 IMPLANT
GLOVE BIOGEL M STRL SZ7.5 (GLOVE) ×3 IMPLANT
GLOVE BIOGEL PI IND STRL 7.0 (GLOVE) IMPLANT
GLOVE BIOGEL PI IND STRL 8 (GLOVE) ×1 IMPLANT
GLOVE BIOGEL PI INDICATOR 7.0 (GLOVE) ×2
GLOVE BIOGEL PI INDICATOR 8 (GLOVE) ×2
GLOVE SURG SS PI 7.0 STRL IVOR (GLOVE) ×4 IMPLANT
GLOVE SURG SS PI 7.5 STRL IVOR (GLOVE) ×2 IMPLANT
GOWN STRL NON-REIN LRG LVL3 (GOWN DISPOSABLE) ×9 IMPLANT
GOWN STRL REIN XL XLG (GOWN DISPOSABLE) ×3 IMPLANT
KIT BASIN OR (CUSTOM PROCEDURE TRAY) ×3 IMPLANT
KIT ROOM TURNOVER OR (KITS) ×3 IMPLANT
NS IRRIG 1000ML POUR BTL (IV SOLUTION) ×3 IMPLANT
PAD ARMBOARD 7.5X6 YLW CONV (MISCELLANEOUS) ×3 IMPLANT
POUCH SPECIMEN RETRIEVAL 10MM (ENDOMECHANICALS) ×3 IMPLANT
SCISSORS LAP 5X35 DISP (ENDOMECHANICALS) ×3 IMPLANT
SET CHOLANGIOGRAPH 5 50 .035 (SET/KITS/TRAYS/PACK) ×3 IMPLANT
SET IRRIG TUBING LAPAROSCOPIC (IRRIGATION / IRRIGATOR) ×3 IMPLANT
SLEEVE ENDOPATH XCEL 5M (ENDOMECHANICALS) ×6 IMPLANT
SPECIMEN JAR SMALL (MISCELLANEOUS) ×3 IMPLANT
SPONGE GAUZE 2X2 STER 10/PKG (GAUZE/BANDAGES/DRESSINGS) ×2
SUT MNCRL AB 4-0 PS2 18 (SUTURE) ×3 IMPLANT
TOWEL OR 17X24 6PK STRL BLUE (TOWEL DISPOSABLE) ×3 IMPLANT
TOWEL OR 17X26 10 PK STRL BLUE (TOWEL DISPOSABLE) ×3 IMPLANT
TRAY LAPAROSCOPIC (CUSTOM PROCEDURE TRAY) ×3 IMPLANT
TROCAR XCEL BLUNT TIP 100MML (ENDOMECHANICALS) ×3 IMPLANT
TROCAR XCEL NON-BLD 5MMX100MML (ENDOMECHANICALS) ×3 IMPLANT

## 2014-01-28 NOTE — Interval H&P Note (Signed)
History and Physical Interval Note:  01/28/2014 8:04 AM  [name]  has presented today for surgery, with the diagnosis of Cholelithiasis  The various methods of treatment have been discussed with the patient and family. After consideration of risks, benefits and other options for treatment, the patient has consented to  Procedure(s): LAPAROSCOPIC CHOLECYSTECTOMY WITH INTRAOPERATIVE CHOLANGIOGRAM (N/A) as a surgical intervention .  The patient's history has been reviewed, patient examined, no change in status, stable for surgery.  I have reviewed the patient's chart and labs.  Questions were answered to the patient's satisfaction.    Chart, imaging, labs reviewed Discussed with pt via language line All questions asked and answered.   Kathy SellaEric Harrell. Kathy CampanileWilson, MD, FACS General, Bariatric, & Minimally Invasive Surgery [facility], [place]   [facility] Center[facility]ILSON,[name]

## 2014-01-28 NOTE — Transfer of Care (Signed)
Immediate Anesthesia Transfer of Care Note  Patient: Kathy SalmonMaria A Harrell  Procedure(s) Performed: Procedure(s): LAPAROSCOPIC CHOLECYSTECTOMY WITH INTRAOPERATIVE CHOLANGIOGRAM (N/A)  Patient Location: PACU  Anesthesia Type:General  Level of Consciousness: sedated  Airway & Oxygen Therapy: Patient Spontanous Breathing and Patient connected to nasal cannula oxygen  Post-op Assessment: Report given to PACU RN, Post -op Vital signs reviewed and stable and Patient moving all extremities  Post vital signs: Reviewed and stable  Complications: No apparent anesthesia complications

## 2014-01-28 NOTE — Op Note (Signed)
Idelle CrouchMaria A Mazurek [id number] 07/28/1981 01/28/2014  Laparoscopic Cholecystectomy with IOC Procedure Note  Indications: This patient presents with symptomatic gallbladder disease and will undergo laparoscopic cholecystectomy.  Pre-operative Diagnosis: Calculus of gallbladder with other cholecystitis, without mention of obstruction  Post-operative Diagnosis: Same  Surgeon: Atilano InaWILSON,Debbrah Sampedro M   Assistants: none  Anesthesia: General endotracheal anesthesia  ASA Class: 1  Procedure Details  The patient was seen again in the Holding Room. The risks, benefits, complications, treatment options, and expected outcomes were discussed with the patient. The possibilities of reaction to medication, pulmonary aspiration, perforation of viscus, bleeding, recurrent infection, finding a normal gallbladder, the need for additional procedures, failure to diagnose a condition, the possible need to convert to an open procedure, and creating a complication requiring transfusion or operation were discussed with the patient. The likelihood of improving the patient's symptoms with return to their baseline status is good.  The patient and/or family concurred with the proposed plan, giving informed consent. The site of surgery properly noted. The patient was taken to Operating Room, identified as Idelle CrouchMaria A Caradonna and the procedure verified as Laparoscopic Cholecystectomy with Intraoperative Cholangiogram. A Time Out was held and the above information confirmed.  Prior to the induction of general anesthesia, antibiotic prophylaxis was administered. General endotracheal anesthesia was then administered and tolerated well. After the induction, the abdomen was prepped with Chloraprep and draped in the sterile fashion. The patient was positioned in the supine position.  Local anesthetic agent was injected into the skin near the umbilicus and an incision made. We dissected down to the abdominal fascia with blunt  dissection.  The fascia was incised vertically and we entered the peritoneal cavity bluntly.  A pursestring suture of 0-Vicryl was placed around the fascial opening.  The Hasson cannula was inserted and secured with the stay suture.  Pneumoperitoneum was then created with CO2 and tolerated well without any adverse changes in the patient's vital signs. An 5-mm port was placed in the subxiphoid position.  Two 5-mm ports were placed in the right upper quadrant. All skin incisions were infiltrated with a local anesthetic agent before making the incision and placing the trocars.   We positioned the patient in reverse Trendelenburg, tilted slightly to the patient's left.  The gallbladder was identified, the fundus grasped and retracted cephalad. Adhesions were lysed bluntly and with the electrocautery where indicated, taking care not to injure any adjacent organs or viscus. The infundibulum was grasped and retracted laterally, exposing the peritoneum overlying the triangle of Calot. This was then divided and exposed in a blunt fashion. A critical view of the cystic duct and cystic artery was obtained.  The cystic duct was clearly identified and bluntly dissected circumferentially. The cystic duct was ligated with a clip distally.   An incision was made in the cystic duct and the [employer] cholangiogram catheter introduced. The catheter was secured using a clip. A cholangiogram was then obtained which showed good visualization of the distal and proximal biliary tree with no sign of filling defects or obstruction.  Contrast flowed easily into the duodenum. The catheter was then removed.   The cystic duct was then ligated with clips and divided. The cystic artery was identified, dissected free, ligated with clips and divided as well. The node was clipped as well.  The gallbladder was dissected from the liver bed in retrograde fashion with the electrocautery. The gallbladder was removed and placed in an Endocatch sac.  The  gallbladder and Endocatch sac were then removed through the  umbilical port site. The liver bed was irrigated and inspected. Hemostasis was achieved with the electrocautery. Copious irrigation was utilized and was repeatedly aspirated until clear.  The pursestring suture was used to close the umbilical fascia.    We again inspected the right upper quadrant for hemostasis.  The umbilical closure was inspected and there was no air leak and nothing trapped within the closure. Pneumoperitoneum was released as we removed the trocars.  4-0 Monocryl was used to close the skin.   Dermabond was applied. The patient was then extubated and brought to the recovery room in stable condition. Instrument, sponge, and needle counts were correct at closure and at the conclusion of the case.   Findings: Early acute Cholecystitis with Cholelithiasis  Estimated Blood Loss: Minimal         Drains: none         Specimens: Gallbladder           Complications: None; patient tolerated the procedure well.         Disposition: PACU - hemodynamically stable.         Condition: stable  Mary Sella. Andrey Campanile, MD, FACS General, Bariatric, & Minimally Invasive Surgery [facility], [place]

## 2014-01-28 NOTE — Progress Notes (Signed)
UR completed 

## 2014-01-28 NOTE — Anesthesia Postprocedure Evaluation (Signed)
Anesthesia Post Note  Patient: Kathy SalmonMaria A Harrell  Procedure(s) Performed: Procedure(s) (LRB): LAPAROSCOPIC CHOLECYSTECTOMY WITH INTRAOPERATIVE CHOLANGIOGRAM (N/A)  Anesthesia type: General  Patient location: PACU  Post pain: Pain level controlled  Post assessment: Patient's Cardiovascular Status Stable  Last Vitals:  Filed Vitals:   01/28/14 1100  BP:   Pulse: 58  Temp:   Resp: 10    Post vital signs: Reviewed and stable  Level of consciousness: alert  Complications: No apparent anesthesia complications

## 2014-01-28 NOTE — Anesthesia Preprocedure Evaluation (Signed)
Anesthesia Evaluation  Patient identified by MRN, date of birth, ID band Patient awake    Reviewed: Allergy & Precautions, H&P , NPO status , Patient's Chart, lab work & pertinent test results, reviewed documented beta blocker date and time   Airway Mallampati: II TM Distance: >3 FB Neck ROM: full    Dental   Pulmonary neg pulmonary ROS,  breath sounds clear to auscultation        Cardiovascular negative cardio ROS  Rhythm:regular     Neuro/Psych negative neurological ROS  negative psych ROS   GI/Hepatic negative GI ROS, Neg liver ROS,   Endo/Other  negative endocrine ROS  Renal/GU negative Renal ROS  negative genitourinary   Musculoskeletal   Abdominal   Peds  Hematology negative hematology ROS (+)   Anesthesia Other Findings See surgeon's H&P   Reproductive/Obstetrics negative OB ROS                           Anesthesia Physical Anesthesia Plan  ASA: I  Anesthesia Plan: General   Post-op Pain Management:    Induction: Intravenous  Airway Management Planned: Oral ETT  Additional Equipment:   Intra-op Plan:   Post-operative Plan: Extubation in OR  Informed Consent: I have reviewed the patients History and Physical, chart, labs and discussed the procedure including the risks, benefits and alternatives for the proposed anesthesia with the patient or authorized representative who has indicated his/her understanding and acceptance.   Dental Advisory Given  Plan Discussed with: CRNA and Surgeon  Anesthesia Plan Comments:         Anesthesia Quick Evaluation  

## 2014-01-28 NOTE — Preoperative (Signed)
Beta Blockers   Reason not to administer Beta Blockers:Not Applicable 

## 2014-01-29 MED ORDER — HYDROCODONE-ACETAMINOPHEN 5-325 MG PO TABS: 1.0000 | ORAL_TABLET | ORAL | Status: DC | PRN

## 2014-01-29 MED ORDER — ACETAMINOPHEN 325 MG PO TABS: 650.0000 mg | ORAL_TABLET | Freq: Four times a day (QID) | ORAL | Status: DC | PRN

## 2014-01-29 NOTE — Discharge Summary (Signed)
[name] M. Phuong Hillary, MD, FACS General, Bariatric, & Minimally Invasive Surgery [facility]  

## 2014-01-29 NOTE — Discharge Summary (Signed)
Patient ID: Idelle CrouchMaria A Harrell MRN: [id number] DOB/AGE: 31/02/1981 32 y.o.  Admit date: 01/27/2014 Discharge date: 01/29/2014  Procedures: lap chole with IOC  Consults: None  Reason for Admission: [name] is a healthy Hispanic speaking female who presents with recurrent abdominal pain. She was seen in the ED on 01/14/14 with like symptoms. She reports that she has been told in the past she needs a cholecystectomy, however, has not had the financial means to have the surgery. This episode of pain began this morning at 3AM. Location is in the epigastric region, LUQ with radiation to the back. She reports nausea, no vomiting, fever, chills or sweats. She reports frequent bouts of abdominal pain with meals. Her symptoms are characterized as sharp, burning and cramping. Time pattern is constant. She denies any modifying, aggravating or alleviating factors. She has not had anything to eat or drink since 7:30PM last night. She denies previous surgeries, no other medical problems. Today she has a normal white count, normal lipase, LFTs. US of abdomen completed on 01/14/14 shows a large gallstone at the neck of the gallbladder.   Admission Diagnoses:  1. Symptomatic cholelithiasis  Hospital Course: the patient was admitted and taken to the OR the following day where she underwent a lap chole.  She was doing well the following day, POD 1.  She was tolerating a regular diet and her pain was well controlled.  She was stable for dc home.  PE: Abd: soft, appropriately tender, ND, incisions c/d/i with dermabond  Discharge Diagnoses:  Active Problems:   Symptomatic cholelithiasis s/p lap chole  Discharge Medications:   Medication List    STOP taking these medications       oxyCODONE-acetaminophen 5-325 MG per tablet  Commonly known as:  PERCOCET/ROXICET      TAKE these medications       acetaminophen 325 MG tablet  Commonly known as:  TYLENOL  Take 2 tablets (650 mg total) by mouth  every 6 (six) hours as needed for mild pain (or Temp > 100).     HYDROcodone-acetaminophen 5-325 MG per tablet  Commonly known as:  NORCO/VICODIN  Take 1 tablet by mouth every 4 (four) hours as needed for moderate pain.        Discharge Instructions:     Follow-up Information   Follow up with [facility] On 02/19/2014. (arrive no later than 1:15PM for a 1:45PM appt, For post operative check up)    Contact information:   [address]   [place] [address] [phone]       Signed: Letha CapeOSBORNE,Caidan Hubbert E 01/29/2014, 8:33 AM

## 2014-01-29 NOTE — Discharge Instructions (Signed)

## 2014-01-29 NOTE — Progress Notes (Signed)
Patient discharged to home with instructions. 

## 2014-02-01 ENCOUNTER — Encounter (HOSPITAL_COMMUNITY): Payer: Self-pay | Admitting: General Surgery

## 2014-02-03 ENCOUNTER — Emergency Department (HOSPITAL_COMMUNITY): Payer: Medicaid Other

## 2014-02-03 ENCOUNTER — Emergency Department (HOSPITAL_COMMUNITY)
Admission: EM | Admit: 2014-02-03 | Discharge: 2014-02-03 | Disposition: A | Discharge status: Home / Self Care | Payer: Medicaid Other | Attending: Emergency Medicine | Admitting: Emergency Medicine

## 2014-02-03 ENCOUNTER — Encounter (HOSPITAL_COMMUNITY): Payer: Self-pay | Admitting: Emergency Medicine

## 2014-02-03 DIAGNOSIS — Z9889 Other specified postprocedural states: Secondary | ICD-10-CM | POA: Insufficient documentation

## 2014-02-03 DIAGNOSIS — Z91040 Latex allergy status: Secondary | ICD-10-CM | POA: Insufficient documentation

## 2014-02-03 DIAGNOSIS — R1011 Right upper quadrant pain: Secondary | ICD-10-CM | POA: Insufficient documentation

## 2014-02-03 DIAGNOSIS — G8918 Other acute postprocedural pain: Secondary | ICD-10-CM | POA: Insufficient documentation

## 2014-02-03 MED ORDER — HYDROCODONE-ACETAMINOPHEN 5-325 MG PO TABS: 1.0000 | ORAL_TABLET | ORAL | Status: DC | PRN

## 2014-02-03 MED ORDER — POLYETHYLENE GLYCOL 3350 17 G PO PACK: 17.0000 g | PACK | Freq: Two times a day (BID) | ORAL | Status: DC | PRN

## 2014-02-03 MED ORDER — DOCUSATE SODIUM 100 MG PO CAPS: 100.0000 mg | ORAL_CAPSULE | Freq: Two times a day (BID) | ORAL | Status: DC

## 2014-02-03 NOTE — ED Notes (Signed)
PT reports RLQ pain worse this morning . Pt took 2tabs of narco 5/325 for Pain. Pt is pain free at this time . Pt also reports her last BM was SAt. And  it was normal for her.Incisions are clean dry and intact.

## 2014-02-03 NOTE — Progress Notes (Signed)
Met patient at bedside.Case Freight forwarder and Education officer, museum at bedside.Patient is spanish speaking but patients significant other speaks english. Social worker provided MEDICAID application and this CM provided MEDICAID PCP list for [facility].Patient provided with spanish 211 Resource.CM provided education on importance of following up with PCP list.Patient and significant other report understanding of today's verbal And written education.

## 2014-02-03 NOTE — ED Notes (Signed)
Social Worker called at [employer] request for [employer] .

## 2014-02-03 NOTE — ED Notes (Signed)
Social worker at bed side with Case Mang. At bed side

## 2014-02-03 NOTE — ED Provider Notes (Signed)
CSN: [id number]     Arrival date & time 02/03/14  [id number] History   First MD Initiated Contact with Patient 02/03/14 [phone]     Chief Complaint  Patient presents with  . Post-op Problem     (Consider location/radiation/quality/duration/timing/severity/associated sxs/prior Treatment) HPI  33yF with abdominal pain. Known to me from previous ED visit for abdominal pain prior to her surgery. Pt is s/p lap chole 01/28/13. Pain improved for a few days after surgery then began having pain again in RUQ and now radiates to R shoulder. Currently pain free. Took two vicodin prior to arrival. Pain resproducibel with deep inspiration. Does not feel SOB. No cough. No fever or chills. Feels bloated after eating. Last BM yesterday. Language barrier. Interpreter phone used.    Past Medical History  Diagnosis Date  . 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 Allergy    Past Surgical History  Procedure Laterality Date  . Cholecystectomy N/A 01/28/2014    Procedure: LAPAROSCOPIC CHOLECYSTECTOMY WITH INTRAOPERATIVE CHOLANGIOGRAM;  Surgeon: Atilano InaEric M [name], MD;  [facility];  Service: General;  Laterality: N/A;   Family History  Problem Relation Age of Onset  . Diabetes Mother   . Hyperlipidemia Mother   . Hypertension Sister    History  Substance Use Topics  . Smoking status: Never Smoker   . Smokeless tobacco: Never Used  . Alcohol Use: Yes   OB History   Grav Para Term Preterm Abortions TAB SAB Ect Mult Living                 Review of Systems  All systems reviewed and negative, other than as noted in HPI.    Allergies  Latex and Tape  Home Medications   Current Outpatient Rx  Name  Route  Sig  Dispense  Refill  . acetaminophen (TYLENOL) 325 MG tablet   Oral   Take 2 tablets (650 mg total) by mouth every 6 (six) hours as needed for mild pain (or Temp > 100).         Marland Kitchen. HYDROcodone-acetaminophen (NORCO/VICODIN)  5-325 MG per tablet   Oral   Take 1 tablet by mouth every 4 (four) hours as needed for moderate pain.   30 tablet   0    BP 128/80  Pulse 80  Temp(Src) 98.6 F (37 C) (Oral)  Resp 18  SpO2 100% Physical Exam  Nursing note and vitals reviewed. Constitutional: She appears well-developed and well-nourished. No distress.  HENT:  Head: Normocephalic and atraumatic.  Eyes: Conjunctivae are normal. Right eye exhibits no discharge. Left eye exhibits no discharge.  Neck: Neck supple.  Cardiovascular: Normal rate, regular rhythm and normal heart sounds.  Exam reveals no gallop and no friction rub.   No murmur heard. Pulmonary/Chest: Effort normal and breath sounds normal. No respiratory distress.  Abdominal: Soft. She exhibits no distension. There is tenderness.  laparoscopic incisions intact with dermabond in place. Minimal RUQ tenderness. No rebound or guarding. No distension.    Musculoskeletal: She exhibits no edema and no tenderness.  Neurological: She is alert.  Skin: Skin is warm and dry.  Psychiatric: She has a normal mood and affect. Her behavior is normal. Thought content normal.    ED Course  Procedures (including critical care time) Labs Review Labs Reviewed - No data to display Imaging Review No results found.  EKG Interpretation   None  MDM   Final diagnoses:  Post-operative pain    33 year old female with right upper quadrant pain with radiation to her right shoulder status post laparoscopic cholecystectomy February 16th. Suspect her pain is from R diaphragmatic irritation given the location of it, referred pain to right shoulder and reproducibility with deep inspiration. She denies any cough, fever shortness of breath. Lungs clear on exam. No increased WOB and normal o2 sat on RA. Low suspicion for pneumonia, but will check CXR.   May be some component from slowed gut motility with recent abdominal surgery and narcotic pain medicine usage. She describes  intermittent bloating/distension and pain often shortly after eating.  Will start bowel regimen.   Overall, pretty benign exam. Just minimal tenderness in RUQ. Certainly with in limits of what I would expect given her surgery 1w ago. Incisions look good. Very low suspicion for serious post-op complication. If CXR normal plan DC with bowel regimen and surgical FU. Return precautions discussed with pt and significant other.     Raeford Razor, MD 02/07/14 1248

## 2014-02-03 NOTE — Discharge Instructions (Signed)
Alivio del dolor antes y después de la cirugía   (Pain Relief Preoperatively and Postoperatively)  Ser un buen paciente no significa callarse la boca.  Si tiene preguntas, problemas o preocupaciones sobre el dolor que puede sentir después de la cirugía, hágaselo saber a su médico.  Los pacientes tienen el derecho a la evaluación y tratamiento del dolor. El tratamiento del dolor después de la cirugía es importante para que pueda acelerar la recuperación y volver a sus actividades normales. El dolor intenso después de la cirugía, y el miedo o la ansiedad asociada con el dolor pueden causar incomodidad extrema que:   · Impide dormir.  · Disminuye la capacidad de respirar profundamente y toser. Esto puede causar neumonía u otras infecciones de las vías respiratorias superiores.  · Hace que el corazón palpite más rápido y la presión arterial sea más alta.  · Aumenta el riesgo de estreñimiento y distensión abdominal.  · Disminuye la capacidad de curación de as heridas.  · Puede resultar en depresión, aumento de la ansiedad y sentimientos de impotencia.  El alivio del dolor antes de la cirugía también es importante porque va a disminuir el dolor después de la cirugía. Los pacientes que reciben alivio para el dolor tanto antes como después de la cirugía, experimentan un mayor alivio del dolor que aquellos que sólo reciben alivio después de la cirugía. Informe a su médico si no puede controlar el dolor.  Esto es muy importante.  El dolor después de la cirugía es más difícil de manejar si se deja que llegue a ser muy intenso, por lo tanto es necesario un tratamiento rápido y adecuado del dolor agudo.   MÉTODOS DE CONTROL DEL DOLOR   Los médicos siguen políticas y procedimientos sobre el manejo del dolor del paciente.  Antes de la cirugía deben explicarle estas directrices.  Los planes para el control del dolor después de la cirugía deben decidirse mutuamente y llevarse a cabo con su pleno conocimiento y consentimiento. No  tenga miedo de hacer preguntas sobre la atención que recibe.  Hay muchas maneras diferentes en que los médicos intentarán controlar el dolor, incluyendo los siguientes métodos.   Control del dolor según la necesidad  · Podrán administrarle un medicamento para el dolor por vía intravenosa (IV) o en forma de comprimido o líquido que deberá tragar. Debe informar a su enfermero cuando siente siente dolor. Entonces le administrarán el medicamento para el dolor que le han indicado.  · El medicamento para el dolor puede causar constipación. Si esto ocurre, en lo posible beba más líquidos. El médico podrá indicarle que tome un laxante suave.  Bomba para analgesia controlada por el paciente por vía IV.   · Puede recibir el medicamento para el dolor a través de una vía IV que se inserta en la vena. Usted puede controlar la cantidad de medicamento para el dolor que recibe. El medicamento fluye a través de un tubo IV y es controlado a través de una bomba. Al presionar un botón, esta bomba administra cierta cantidad de medicamento. Nadie debe presionar este botón, sólo usted o alguien específicamente designado por usted para hacerlo. Fue ideada para evitar que accidentalmente reciba demasiada cantidad de medicamento. Podrá comenzar a utilizar la bomba en la sala de recuperación después de la cirugía. Este método es útil en la mayoría de las cirugías.  · Si todavía siente demasiado dolor, informe a su médico. Además, dígale si se siente muy somnoliento o mareado.  Control epidural continuo para el dolor   · Le insertarán en la   espalda un tubo delgado y flexible (catéter). El medicamento fluye a través del catéter para aliviar el dolor en la zona del cuerpo en donde se realiza la cirugía. Funciona mejor si fue sometido a una cirugía en el pecho, abdomen, área de la cadera o las piernas. El catéter epidural se inserta en la espalda, justo antes de la cirugía. Se deja hasta que pueda comer y tomar medicamentos por vía oral. En la  mayoría de los casos, esto ocurre luego de 2 a 3 días.  · La administración del analgésico a través del catéter epidural puede ayudarle a sanar más rápido debido a que:  · El intestino recupera su ritmo normal.  · Podrá volver a comer más pronto.  · Puede estar de pie y caminar más rápido.  Medicamentos que adormecen el área (anestesia local)   · Es posible que le apliquen una inyección de un medicamento para el dolor cerca del lugar donde le duele (infiltración local).  · Podrán aplicarle la inyección cerca del nervio que controla la sensación de una zona específica del cuerpo (bloqueo nervioso periférico).  · Los medicamentos pueden ser inyectados en la columna vertebral para bloquear el dolor (bloqueoespinal).  Opioides  · El dolor agudo moderado a moderadamente intenso que se siente después de una cirugía puede responder a los opioides. Los opioides son medicamentos narcóticos para calmar el dolor. Generalmente se combinan con medicamentos no narcóticos para aumentar el alivio del dolor, disminuir el riesgo de efectos secundarios, y reducir la posibilidad de adicción.  · Si sigue las indicaciones de su médico sobre como tomar los opioides y no tiene antecedentes de consumo de drogas, el riesgo de adicción es excepcionalmente pequeño.  Los opioides se administran durante períodos cortos en dosis precisas para evitar la adicción.  Otros métodos de control del dolor son:   · Corticoides.  · Fisioterapia.  · Terapia con calor y con frío.  · Compresión, tales como colocar una venda elástica alrededor de la zona del dolor.  · Masajes.  Estas diversas formas de control del dolor pueden usarse juntas. La combinación de diferentes métodos de control del dolor se denomina analgesia multimodal. El uso de este método tiene muchas ventajas, incluyendo la posibilidad de comer, moverse y salir del hospital anticipadamente.   Document Released: 11/18/2011 Document Revised: 02/21/2012  Exit[employer]® Patient Information ©2014  [employer].

## 2014-02-03 NOTE — Progress Notes (Signed)
Weekend CSW, alongside CM, met with patient to provide spanish medicaid application and medicaid PCP provider list. CSW and CM answered patient's questions with the assistance of English speaking significant other. No other social work needs identified. Please re-consult if further social work needs arise.  Tilden Fossa, MSW, [employer] Clinical Social Worker [facility]. [phone]

## 2014-02-03 NOTE — ED Notes (Signed)
Social Worker and Sports coachCase Manager at bed side to assist Pt with [employer] info and PCP information.

## 2014-02-19 ENCOUNTER — Ambulatory Visit (INDEPENDENT_AMBULATORY_CARE_PROVIDER_SITE_OTHER): Payer: Self-pay | Admitting: General Surgery

## 2014-02-19 ENCOUNTER — Encounter (INDEPENDENT_AMBULATORY_CARE_PROVIDER_SITE_OTHER): Payer: Self-pay

## 2014-02-19 VITALS — BP 122/70 | HR 76 | Temp 97.8°F | Resp 14 | Ht 60.0 in | Wt 151.0 lb

## 2014-02-19 DIAGNOSIS — Z9889 Other specified postprocedural states: Secondary | ICD-10-CM

## 2014-02-19 DIAGNOSIS — Z9049 Acquired absence of other specified parts of digestive tract: Secondary | ICD-10-CM

## 2014-02-19 NOTE — Patient Instructions (Signed)
You may resume normal activities.  Follow up as needed. 

## 2014-02-19 NOTE — Progress Notes (Signed)
Eran A Mcclanahan 07/23/1981 [id number] 02/19/2014   Idelle CrouchMaria A Newburn is a 33 y.o. female who had a laparoscopic cholecystectomy with intraoperative cholangiogram by Dr. Gaynelle AduEric [name].  The pathology report confirmed chronic cholecystitis and choleithiasis.  The patient reports that they are feeling well with normal bowel movements and good appetite.  The pre-operative symptoms of abdominal pain, nausea, and vomiting have resolved.    Physical examination - Incisions appear well-healed with no sign of infection or bleeding.   Abdomen - soft, non-tender  Impression:  s/p laparoscopic cholecystectomy  Plan:  She may resume a regular diet and full activity.  She may follow-up on a PRN basis.

## 2014-02-26 ENCOUNTER — Encounter (INDEPENDENT_AMBULATORY_CARE_PROVIDER_SITE_OTHER): Payer: Self-pay

## 2014-03-05 ENCOUNTER — Ambulatory Visit: Payer: Self-pay

## 2015-11-27 IMAGING — CT CT ABD-PELV W/O CM
2 of 4 series · 16 of 46 positions shown, 18 images · non-contrast
Comparison: None.

CLINICAL DATA: Right flank pain.  Hematuria.

EXAM:
CT ABDOMEN AND PELVIS WITHOUT CONTRAST
TECHNIQUE: Multidetector CT imaging of the abdomen and pelvis was performed
following the standard protocol without intravenous contrast.

[Series 2: stone study 5.0 i30f 1 · axial · 0.64mm/px · z∈[-794,-368]mm · 13 of 93 slices shown, 15 images]
[im 4/93  soft-tissue]
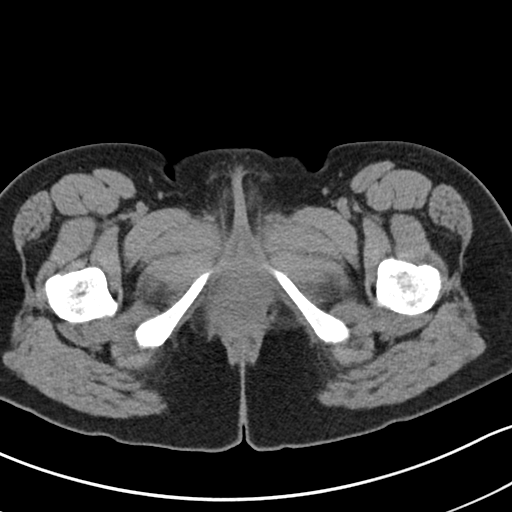
[im 4/93  bone]
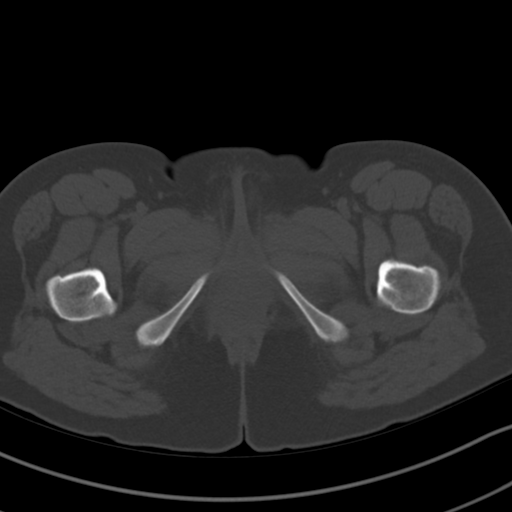
[im 12/93  soft-tissue]
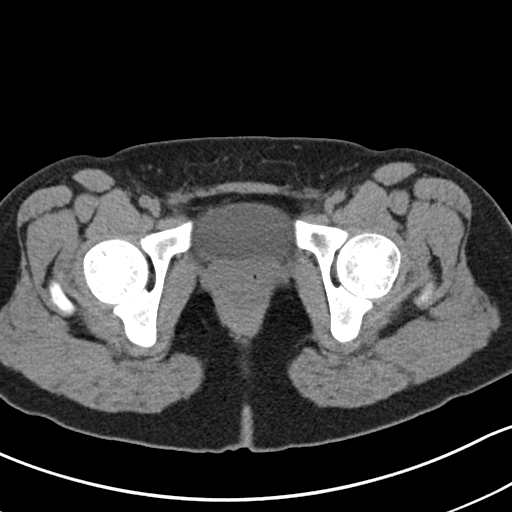
[im 20/93  soft-tissue]
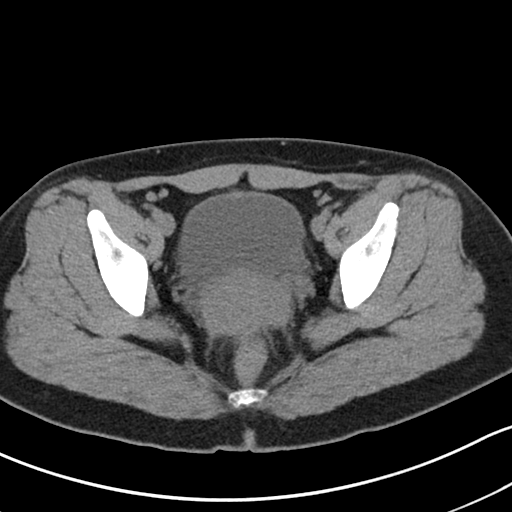
[im 27/93  soft-tissue]
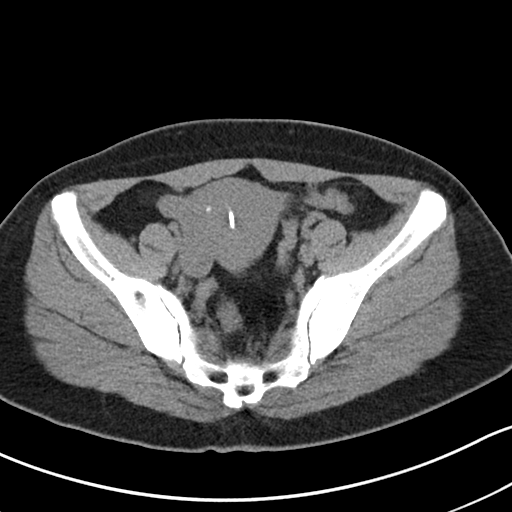
[im 31/93  soft-tissue]
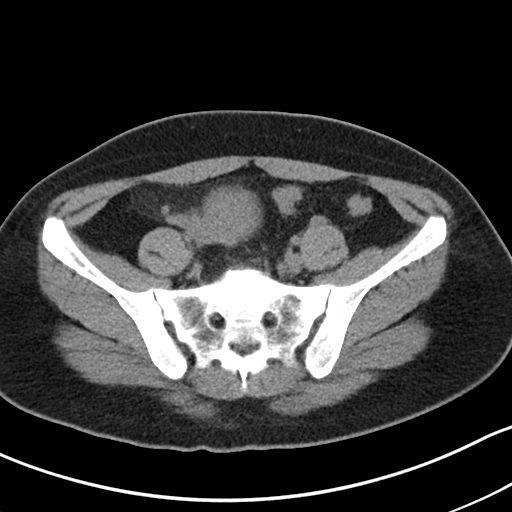
[im 39/93  soft-tissue]
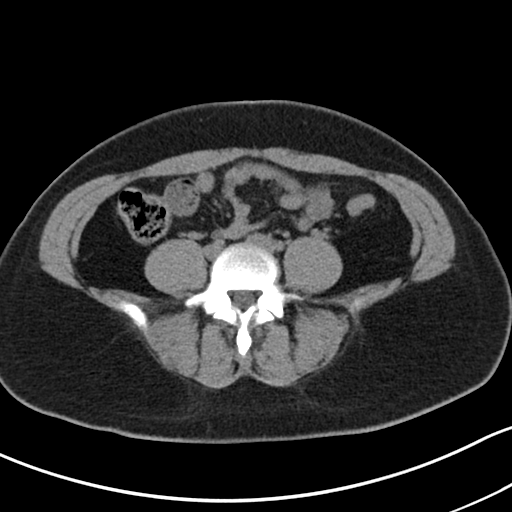
[im 47/93  soft-tissue]
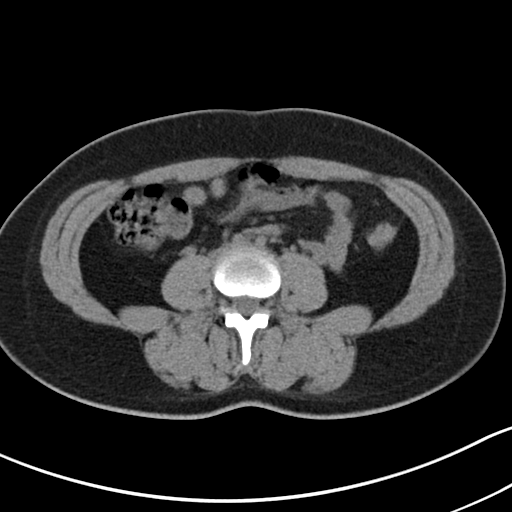
[im 54/93  soft-tissue]
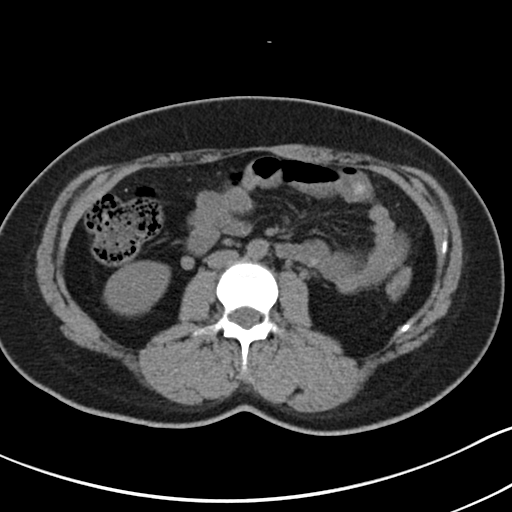
[im 62/93  soft-tissue]
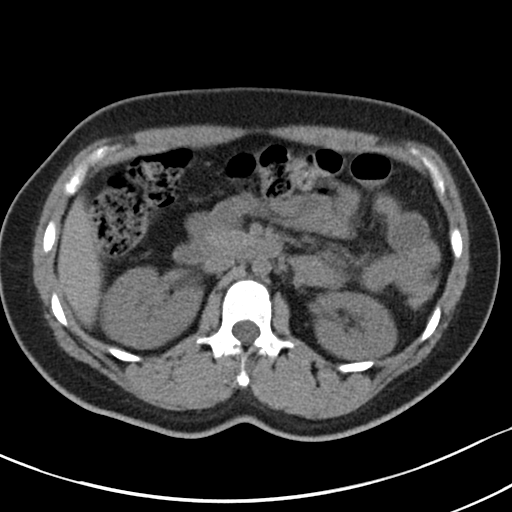
[im 62/93  bone]
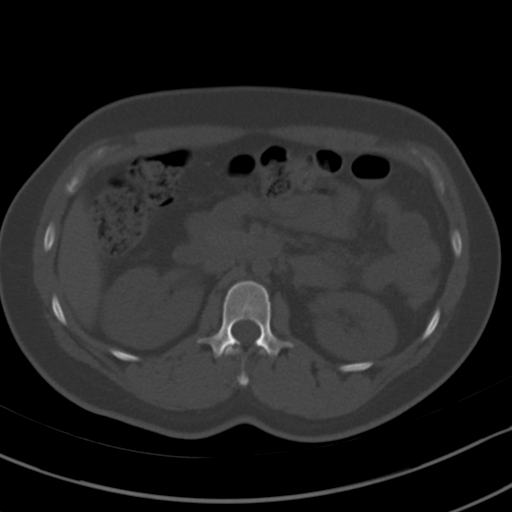
[im 66/93  soft-tissue]
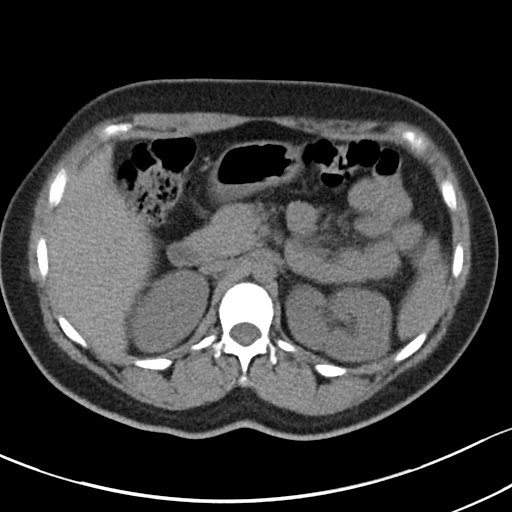
[im 73/93  soft-tissue]
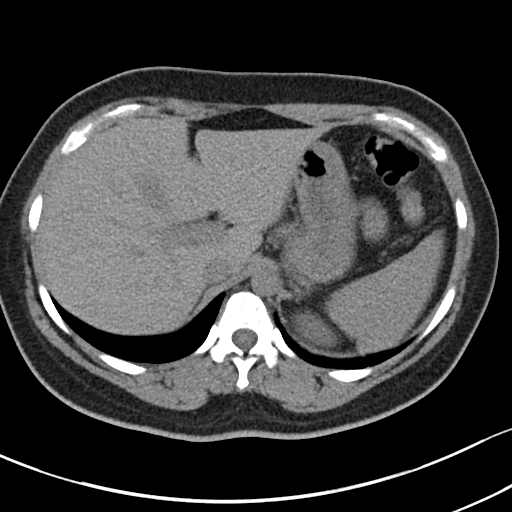
[im 81/93  soft-tissue]
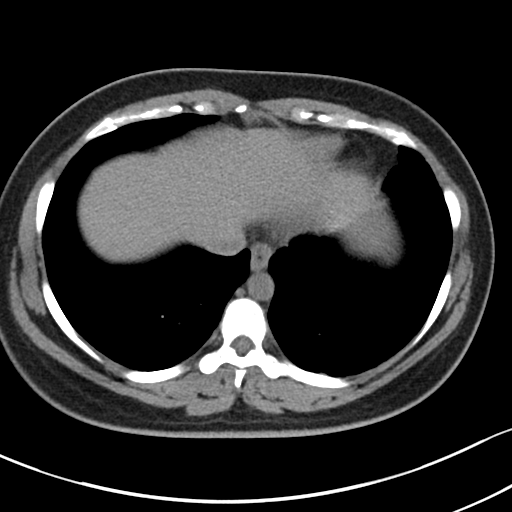
[im 89/93  soft-tissue]
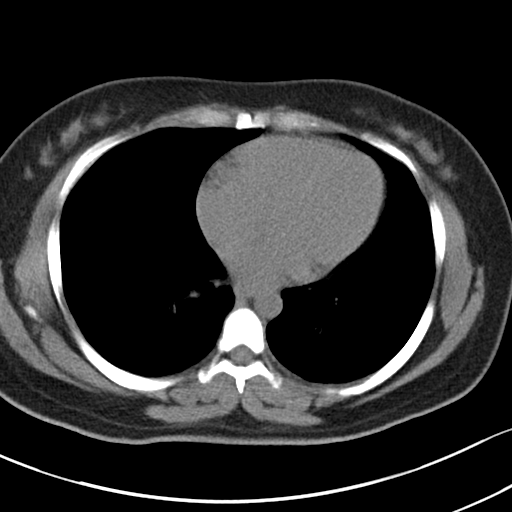

[Series 5: coronal soft tissue · coronal · 0.72mm/px · 3 of 99 slices shown]
[im 33/99  soft-tissue]
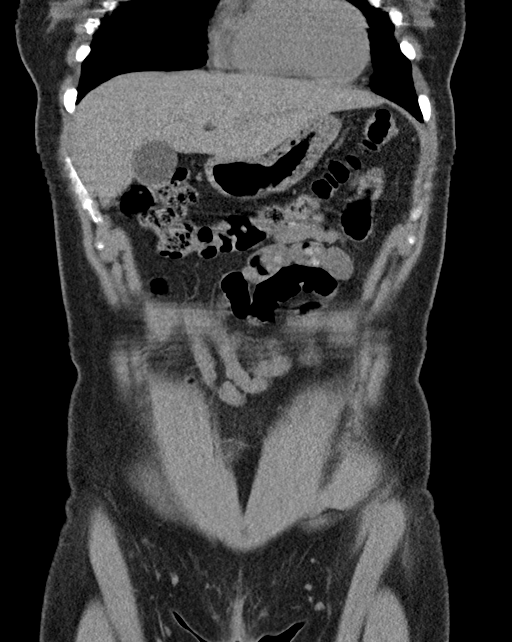
[im 44/99  soft-tissue]
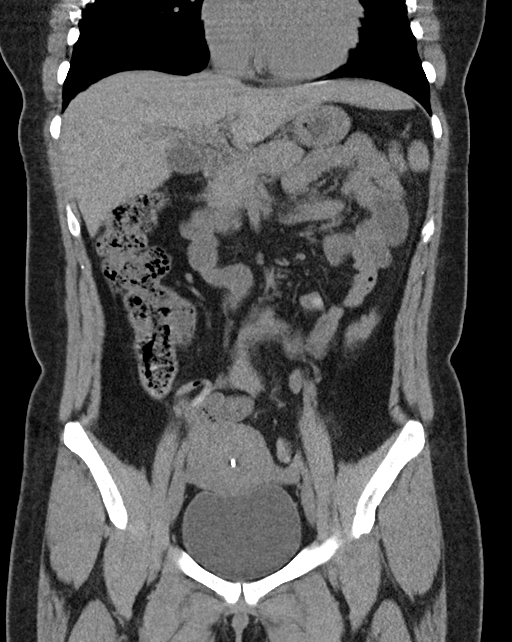
[im 55/99  soft-tissue]
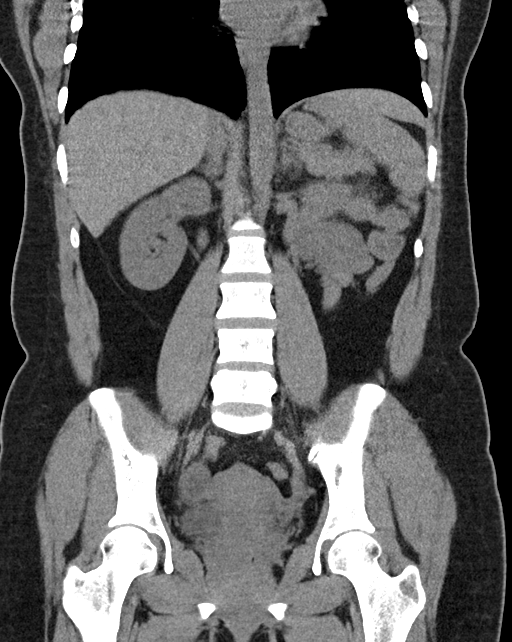

[16 of 46 positions shown; findings below may reference images not displayed]

FINDINGS: The lung bases are clear without focal nodule, mass, or airspace
disease. The heart size is normal. No significant pleural or
pericardial effusion is present.

The liver and spleen are within normal limits. The stomach,
duodenum, and pancreas are unremarkable. Common bile duct and
gallbladder are within normal limits. The adrenal glands are normal
bilaterally. The kidneys and ureters are unremarkable. There is no
evidence for stone are obstruction. The urinary bladder is within
normal limits.

The rectus sigmoid colon is within normal limits. The remainder the
colon is unremarkable. The appendix is visualized and within normal
limits. An IUD is in place. The uterus and adnexa are otherwise
within normal limits. No significant adenopathy or free fluid is
present.

The bone windows are unremarkable.
IMPRESSION: 1. No evidence for nephrolithiasis or urinary obstruction.
2. No acute or focal abnormality in the abdomen or pelvis to explain
the patient's symptoms.

## 2016-02-05 ENCOUNTER — Ambulatory Visit (INDEPENDENT_AMBULATORY_CARE_PROVIDER_SITE_OTHER): Payer: Self-pay | Admitting: Family Medicine

## 2016-02-05 ENCOUNTER — Ambulatory Visit (INDEPENDENT_AMBULATORY_CARE_PROVIDER_SITE_OTHER): Payer: Self-pay

## 2016-02-05 VITALS — BP 126/70 | HR 75 | Temp 98.9°F | Resp 16 | Ht 61.0 in | Wt 121.0 lb

## 2016-02-05 DIAGNOSIS — S29019A Strain of muscle and tendon of unspecified wall of thorax, initial encounter: Secondary | ICD-10-CM

## 2016-02-05 DIAGNOSIS — S161XXA Strain of muscle, fascia and tendon at neck level, initial encounter: Secondary | ICD-10-CM

## 2016-02-05 DIAGNOSIS — S29009A Unspecified injury of muscle and tendon of unspecified wall of thorax, initial encounter: Secondary | ICD-10-CM

## 2016-02-05 DIAGNOSIS — M546 Pain in thoracic spine: Secondary | ICD-10-CM

## 2016-02-05 DIAGNOSIS — F4321 Adjustment disorder with depressed mood: Secondary | ICD-10-CM

## 2016-02-05 DIAGNOSIS — M542 Cervicalgia: Secondary | ICD-10-CM

## 2016-02-05 DIAGNOSIS — G44319 Acute post-traumatic headache, not intractable: Secondary | ICD-10-CM

## 2016-02-05 MED ORDER — IBUPROFEN 800 MG PO TABS: 800.0000 mg | ORAL_TABLET | Freq: Three times a day (TID) | ORAL | Status: DC | PRN

## 2016-02-05 MED ORDER — CYCLOBENZAPRINE HCL 5 MG PO TABS: 5.0000 mg | ORAL_TABLET | Freq: Every day | ORAL | Status: DC

## 2016-02-05 MED ORDER — CITALOPRAM HYDROBROMIDE 20 MG PO TABS: 20.0000 mg | ORAL_TABLET | Freq: Every day | ORAL | Status: DC

## 2016-02-05 NOTE — Patient Instructions (Addendum)
Because you received an x-ray today, you will receive an invoice from [facility]. Please contact [facility] at [phone] with questions or concerns regarding your invoice. Our billing staff will not be able to assist you with those questions.  Recommend calling one of the following therapist:  Tamela Gammon   [phone] Levonne Hubert [phone] [facility]  [phone] Coral Spikes  [phone]
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
## 2016-10-05 ENCOUNTER — Encounter: Payer: Self-pay | Admitting: *Deleted

## 2016-10-19 ENCOUNTER — Ambulatory Visit (INDEPENDENT_AMBULATORY_CARE_PROVIDER_SITE_OTHER): Payer: Self-pay | Admitting: Obstetrics & Gynecology

## 2016-10-19 ENCOUNTER — Encounter: Payer: Self-pay | Admitting: Obstetrics & Gynecology

## 2016-10-19 ENCOUNTER — Other Ambulatory Visit (HOSPITAL_COMMUNITY)
Admission: RE | Admit: 2016-10-19 | Discharge: 2016-10-19 | Disposition: A | Discharge status: Home / Self Care | Payer: Self-pay | Source: Ambulatory Visit | Attending: Obstetrics & Gynecology | Admitting: Obstetrics & Gynecology

## 2016-10-19 VITALS — BP 112/66 | HR 55 | Ht 59.0 in | Wt 124.0 lb

## 2016-10-19 DIAGNOSIS — Z3202 Encounter for pregnancy test, result negative: Secondary | ICD-10-CM

## 2016-10-19 DIAGNOSIS — R87613 High grade squamous intraepithelial lesion on cytologic smear of cervix (HGSIL): Secondary | ICD-10-CM

## 2016-10-19 DIAGNOSIS — D069 Carcinoma in situ of cervix, unspecified: Secondary | ICD-10-CM | POA: Insufficient documentation

## 2016-10-19 LAB — POCT PREGNANCY, URINE: PREG TEST UR: NEGATIVE

## 2016-10-19 NOTE — Addendum Note (Signed)
Addended by: Marylynn PearsonHILLMAN, CARRIE L on: 10/19/2016 05:00 PM   Modules accepted: Orders

## 2016-10-19 NOTE — Progress Notes (Signed)
Video interpreter [phone]#[id number]

## 2016-10-19 NOTE — Patient Instructions (Signed)
Procedimiento de escisin electroquirrgica con asa - Cuidados posteriores  (Loop Electrosurgical Excision Procedure, Care After) Siga estas instrucciones durante las prximas semanas. Estas indicaciones le proporcionan informacin general acerca de cmo deber cuidarse despus del procedimiento. El mdico tambin podr darle instrucciones especficas. El tratamiento ha sido planificado segn las prcticas mdicas actuales, pero en algunos casos pueden ocurrir problemas. Comunquese con el mdico si tiene algn problema o tiene preguntas despus del procedimiento.  INSTRUCCIONES PARA EL CUIDADO EN EL HOGAR   No use tampones, no se d duchas vaginales ni tenga relaciones sexuales durante 2 semanas, o segn lo que le indique su mdico.  Comience con las actividades habituales si no tiene o tiene mnimo de clicos y sangrado, excepto que el mdico le indique lo contrario.  Tmese la temperatura si se siente enfermo. Anote la temperatura en un papel e informe a su mdico que tiene fiebre.  Tome todos los medicamentos segn le indic su mdico.  Cumpla con todas las visitas de control y los papanicolau, segn le indique su mdico. SOLICITE ATENCIN MDICA DE INMEDIATO SI:   Tiene un sangrado ms abundante o que dura ms que el ciclo menstrual normal.  Tiene un sangrado de color rojo brillante.  Elimina cogulos de sangre.  Tiene fiebre.  Siente clicos o el dolor no se alivia con la medicacin.  Siente dolor abdominal que no parece estar relacionado con la misma zona en que sinti los clicos y el dolor.  Se siente mareada, dbil o se desmaya.  Comienza a sentir dolor al orinar u observa sangre.  Tiene una secrecin vaginal con mal olor. ASEGRESE DE QUE:   Comprende estas instrucciones.  Controlar su enfermedad.  Solicitar ayuda de inmediato si no mejora o si empeora.   Esta informacin no tiene como fin reemplazar el consejo del mdico. Asegrese de hacerle al mdico cualquier  pregunta que tenga.   Document Released: 08/12/2011 Document Revised: 02/21/2012 Elsevier Interactive Patient Education 2016 Elsevier Inc.  

## 2016-10-19 NOTE — Progress Notes (Addendum)
Pap smear and colposcopy reviewed.   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  Role of HPV,cervical dysplasia and need for close followup was empasized. Informed written consent was obtained. All questions were answered. Time out performed.  Procedure: The patient was placed in lithotomy position and the bivalved coated speculum was placed in the patient's vagina. A grounding pad placed on the patient. Local anesthesia was administered via an intracervical block using 10cc of 2% Lidocaine with epinephrine. The suction was turned on and the Medium 1X Fisher Cone Biopsy Excisor on 2560 Watts of cutting current was used to excise the area of decreased uptake and excise the entire transformation zone. Excellent hemostasis was achieved using roller ball coagulation set at 80 Watts coagulation current. Monsel's solution was then applied and excellent hemostasis was noted.  The speculum was removed from the vagina. Specimens were sent to pathology. The patient tolerated the procedure well. Post-operative instructions given to patient, including instruction to seek medical attention for persistent bright red bleeding, fever, abdominal/pelvic pain, dysuria, nausea or vomiting. She was also told about the possibility of having copious yellow to black tinged discharge. She was counseled to avoid anything in the vagina (sex/douching/tampons) for 4 weeks. She has a  4 week post-operative check to review results and assess wound healing. Follow up sooner prn.  [place] Andreoni L. Harraway-Smith, M.D., Ever[facility] CoreFACOG

## 2016-10-25 ENCOUNTER — Ambulatory Visit (INDEPENDENT_AMBULATORY_CARE_PROVIDER_SITE_OTHER): Payer: Self-pay | Admitting: Clinical

## 2016-10-25 ENCOUNTER — Telehealth: Payer: Self-pay | Admitting: *Deleted

## 2016-10-25 DIAGNOSIS — F4323 Adjustment disorder with mixed anxiety and depressed mood: Secondary | ICD-10-CM

## 2016-10-25 NOTE — Telephone Encounter (Signed)
Per message from Dr. Erin FullingHarraway-Smith called patient with interpreter Maretta LosBlanca Lindner and informed her of results of LEEP and needs pap in 4 months and also needs to keep appointment already scheduled for LEEP follow up. She voices understanding.

## 2016-10-25 NOTE — BH Specialist Note (Signed)
Session Start time: 1:20  End Time: 2:05 Total Time:  45 minutes Type of Service: Behavioral Health - Individual/Family Interpreter: Yes.     Interpreter Name & Language: Myrtha MantisJuan, Spanish # [facility] July [facility]-June 2018[facility]   SUBJECTIVE: Kathy SalmonMaria A Harrell is a 35 y.o. female  Pt. was referred by Willodean Rosenthalarolyn Harraway-Smith, MD for:  anxiety and depression. Pt. reports the following symptoms/concerns: Pt states that she has felt sad and alone after her husband passed away in an accident in February; she tries to deal with it on her own, but her children, ages 618,12, and 4714 also experiencing grief/loss. The most pressing concern today is concern over future of her children if she were to pass away because of irregularities found on pap smear. 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 Pt may benefit from psychoeducation and brief therapeutic intervention regarding coping with symptoms of anxiety and depression.    PLAN: 1. F/U with behavioral health clinician: As needed 2. Behavioral  Health meds: none 3. Behavioral recommendations:  -Consider calling [facility] to set up appointment for family counseling with Spanish-speaking counselor -Consider educational material regarding coping with symptoms of anxiety and depression -Consider [employer] (English and other classes and services) and [facility](resources, classes) as additional social supports -Consider applying for orange card at Department of Social Services (bring Medicaid denial letter, if available) 4. Referral: Brief Counseling/Psychotherapy, Publishing rights managerCommunity Resource and Psychoeducation 5. From scale of 1-10, how likely are you to follow plan: Uncertain   Woc-Behavioral Health Clinician  Behavioral Health Clinician  Marlon PelWarmhandoff:   Warm Hand Off Completed.        Depression screen [facility]/9 10/25/2016 10/19/2016  Decreased Interest 1 1  Down, Depressed, Hopeless 1 1  PHQ - 2 Score 2 2  Altered sleeping 0 0  Tired, decreased energy 1 1  Change in appetite 2 1  Feeling bad or failure about yourself  1 1  Trouble concentrating 2 1  Moving slowly or fidgety/restless 2 1  Suicidal thoughts 0 1  PHQ-9 Score 10 8   GAD 7 : Generalized Anxiety Score 10/25/2016 10/19/2016  Nervous, Anxious, on Edge 1 1  Control/stop worrying 1 1  Worry too much - different things 2 3  Trouble relaxing 1 1  Restless 1 1  Easily annoyed or irritable 2 2  Afraid - awful might happen 3 1  Total GAD 7 Score 11 10

## 2016-11-25 ENCOUNTER — Encounter: Payer: Self-pay | Admitting: Obstetrics & Gynecology

## 2016-11-25 ENCOUNTER — Ambulatory Visit (INDEPENDENT_AMBULATORY_CARE_PROVIDER_SITE_OTHER): Payer: Self-pay | Admitting: Obstetrics & Gynecology

## 2016-11-25 ENCOUNTER — Ambulatory Visit (INDEPENDENT_AMBULATORY_CARE_PROVIDER_SITE_OTHER): Payer: Self-pay | Admitting: Clinical

## 2016-11-25 VITALS — BP 102/64 | HR 60 | Wt 122.0 lb

## 2016-11-25 DIAGNOSIS — F4323 Adjustment disorder with mixed anxiety and depressed mood: Secondary | ICD-10-CM

## 2016-11-25 DIAGNOSIS — R87613 High grade squamous intraepithelial lesion on cytologic smear of cervix (HGSIL): Secondary | ICD-10-CM

## 2016-11-25 NOTE — Progress Notes (Signed)
History:  35 y.o. [id number] here today for post LEEP check. Pt reports no abnormal bleeding or discharge.    The following portions of the patient's history were reviewed and updated as appropriate: allergies, current medications, past family history, past medical history, past social history, past surgical history and problem list.  Review of Systems:  Pertinent items are noted in HPI.   Objective:  Physical Exam Blood pressure 102/64, pulse 60, weight 122 lb (55.3 kg). Gen: NAD Pelvic: Normal appearing external genitalia; normal appearing vaginal mucosa and cervix.  Normal discharge.  Small uterus, no other palpable masses, no uterine or adnexal tenderness  Labs and Imaging 10/19/2016 Diagnosis Cervix, LEEP - HIGH GRADE SQUAMOUS INTRAEPITHELIAL LESION, CIN-III (SEVERE DYSPLASIA/CIS). - DYSPLASIA IS PRESENT AT THE ENDOCERVICAL RESECTION MARGIN AT THE 12-3 O'CLOCK QUADRANT.  Assessment & Plan:  High grade dysplasia with + margins  Reviewed surg path Repeat PAP in 4 months.  Brenin Heidelberger L. Harraway-Smith, M.D., [facility]   Lynnie Koehler L. Harraway-Smith, M.D., [facility] CoreFACOG

## 2016-11-25 NOTE — BH Specialist Note (Signed)
Session Start time: 4:00   End Time: 4:20 Total Time:  20 minutes Type of Service: Behavioral Health - Individual/Family Interpreter: Yes.     Interpreter Name & Language: Efraim KaufmannCecilia, Spanish # [facility] July 2017-June 2018: 2nd  SUBJECTIVE: Kathy SalmonMaria A Harrell is a 35 y.o. female  Pt. was referred by f/u  for:  anxiety and depression. Pt. reports the following symptoms/concerns: Pt states that priority in her immediate family has been to obtain psychiatric help for teen son; feels that child is being helped by services, and will now consider grief counseling for herself and children.  Duration of problem:  10 months(loss of husband), over one month increase in stress due to health and family issues Severity: moderate Previous treatment: none  OBJECTIVE: Mood: Appropriate & Affect: Appropriate Risk of harm to self or others: No known risk of harm to self or others, no SI, no HI (thoughts of death related to death of spouse, not thoughts of harm to self) Assessments administered: PHQ9: 17  LIFE CONTEXT:  Family & Social: Lives with three children (8,12,14) School/ Work: Works Economistfulltime Self-Care: Sleep unchanged (4 hours/night since loss) Life changes: Loss of husband 10 months prior What is important to pt/family (values): Children  GOALS ADDRESSED:  -Reduce symptoms of anxiety and depression  INTERVENTIONS: Strength-based, Supportive and Family Systems   ASSESSMENT:  Pt currently experiencing Adjustment disorder with mixed anxious and depressed mood.  Pt may benefit from brief therapeutic interventions regarding coping with symptoms of depression and anxiety.   PLAN: 1. F/U with behavioral health clinician: As needed 2. Behavioral Health meds: none 3. Behavioral recommendations:  -Continue taking son to psychiatry/therapy appointments -[facility] -Consider hospice grief support : Finding Our Way: After the Loss of a Spouse  group by calling [phone] to register (Spanish-speaking interpreters available) 4. Referral: Brief Counseling/Psychotherapy and Supportive Counseling 5. From scale of 1-10, how likely are you to follow plan: Undetermined  Rae LipsJamie C Mcmannes LCSWA Behavioral Health Clinician  Warmhandoff: no  Depression screen [facility] 2/9 11/25/2016 10/25/2016 10/19/2016  Decreased Interest 2 1 1   Down, Depressed, Hopeless 2 1 1   PHQ - 2 Score 4 2 2   Altered sleeping 2 0 0  Tired, decreased energy 2 1 1   Change in appetite 2 2 1   Feeling bad or failure about yourself  2 1 1   Trouble concentrating 2 2 1   Moving slowly or fidgety/restless 2 2 1   Suicidal thoughts 1 0 1  PHQ-9 Score 17 10 8    GAD 7 : Generalized Anxiety Score 10/25/2016 10/19/2016  Nervous, Anxious, on Edge 1 1  Control/stop worrying 1 1  Worry too much - different things 2 3  Trouble relaxing 1 1  Restless 1 1  Easily annoyed or irritable 2 2  Afraid - awful might happen 3 1  Total GAD 7 Score 11 10

## 2017-04-06 ENCOUNTER — Ambulatory Visit: Payer: Self-pay | Admitting: Obstetrics & Gynecology

## 2017-04-25 ENCOUNTER — Encounter: Payer: Self-pay | Admitting: Obstetrics & Gynecology

## 2017-04-25 ENCOUNTER — Ambulatory Visit (INDEPENDENT_AMBULATORY_CARE_PROVIDER_SITE_OTHER): Payer: Self-pay | Admitting: Obstetrics & Gynecology

## 2017-04-25 VITALS — BP 119/73 | HR 59 | Ht <= 58 in | Wt 120.0 lb

## 2017-04-25 DIAGNOSIS — N898 Other specified noninflammatory disorders of vagina: Secondary | ICD-10-CM

## 2017-04-25 DIAGNOSIS — N879 Dysplasia of cervix uteri, unspecified: Secondary | ICD-10-CM

## 2017-04-25 NOTE — Progress Notes (Signed)
History:  36 y.o. [id number] here today for f/u PAP after LEEP that had + margins from the LEEP specimen.  Pt reviewed that she has some discomfort with intercourse and feels dry with intercourse.  Has decreased libido but, does feel aroused after initiation of sexual activity.  The following portions of the patient's history were reviewed and updated as appropriate: allergies, current medications, past family history, past medical history, past social history, past surgical history and problem list.  Review of Systems:  Pertinent items are noted in HPI.   Objective:  Physical Exam Blood pressure 119/73, pulse (!) 59, height 4\' 10"  (1.473 m), weight 120 lb (54.4 kg), last menstrual period 04/08/2017. CONSTITUTIONAL: Well-developed, well-nourished female in no acute distress.  HENT:  Normocephalic, atraumatic EYES: Conjunctivae and EOM are normal. No scleral icterus.  NECK: Normal range of motion SKIN: Skin is warm and dry. No rash noted. Not diaphoretic.No pallor. NEUROLGIC: Alert and oriented to person, place, and time. Normal coordination.  Abd: Soft, nontender and nondistended Pelvic: Normal appearing external genitalia; normal appearing vaginal mucosa and cervix.  Normal discharge.  Small uterus, no other palpable masses, no uterine or adnexal tenderness  Labs and Imaging 10/19/2016 Diagnosis Cervix, LEEP - HIGH GRADE SQUAMOUS INTRAEPITHELIAL LESION, CIN-III (SEVERE DYSPLASIA/CIS). - DYSPLASIA IS PRESENT AT THE ENDOCERVICAL RESECTION MARGIN AT THE 12-3 O'CLOCK QUADRANT.  Assessment & Plan:  f/u cervical dysplasia   F/u PAP Reviewed vaginal lubricants. F/u in 6 months or sooner prn F/u may change with results of PAP   Spanish interpreter [phone]  Mayo Regional HospitalCarolyn L. Harraway-Smith, M.D., [facility] CoreFACOG

## 2017-04-25 NOTE — Patient Instructions (Signed)
Lubricants  - Water or silicone-based  - No perfumes; avoid glycerin or parabens  - If water based look for information on osmolality  - Lubricate all surfaces as a part of foreplay  - Keep lubricant handy in case more is needed  - Sneaking into bathroom before sex is not a good way to use lubricants    Lubricantes- Basado en agua o silicona- Sin perfumes; evitar la glicerina o los parabenos- Si se basa en agua, busque informacin sobre la osmolalidad- Lubrica todas las superficies como parte de los juegos previos- Mantenga el lubricante a Emergency planning/management officermano en caso de que se necesite ms- Escabullirse en el bao antes de tener sexo no es una buena Wellsite geologistmanera de usar lubricantes</SPAN>  [place] liquibeads

## 2017-04-27 LAB — CYTOLOGY - PAP
Diagnosis: NEGATIVE
HPV (WINDOPATH): NOT DETECTED

## 2017-04-28 ENCOUNTER — Encounter: Payer: Self-pay | Admitting: Obstetrics & Gynecology

## 2017-05-12 ENCOUNTER — Telehealth: Payer: Self-pay | Admitting: *Deleted

## 2017-05-12 NOTE — Telephone Encounter (Signed)
Received a voice mail from someone stating they are a nurse in [facility] and that [name] wants her pap smear results. I called her with interpreter Marchelle Folksmanda and a lady answered and then phone was disconnected. I called back and we left a message we are calling to return a call made for her for results- please call our office .

## 2017-05-16 NOTE — Telephone Encounter (Signed)
I called [name] again with [employer] [phone] and left a message I am calling with some information- please call our office. ( per review pap was negative).

## 2019-12-14 NOTE — L&D Delivery Note (Addendum)
OB/GYN Faculty Practice Delivery Note  [name] is a 39 y.o. [id number] s/p VAVD at [redacted]w[redacted]d. She was admitted for IOL for oligohydramnio.   ROM: 1h 67m with blood tinged fluid GBS Status:  Negative/-- (12/02 1514) Maximum Maternal Temperature: 99  Labor Progress: Initial SVE: 1 cm. She received FB and cytotec and was transitioned to pitocin. Patient had severe range blood pressures and was started on magnesium at 0025 on 12/11. She then progressed to complete.   Delivery Date/Time: 12/11 at [phone]  Delivery: Called to room and patient was complete and pushing. Patient was noted to be +2 station with recurrent prolonged decelerations to 80's and poor recovery to baseline.   Indication for operative vaginal delivery: NRFHT close to delivery   Patient was examined and found to be fully dilated with fetal station of +2.  Patient's bladder was noted to be empty, and there were no known fetal contraindications to operative vaginal delivery. EFW was 6lbs by Le[place]olds/recent ultrasound.  FHR tracing remarkable for prolonged decelerations to 80's with poor recovery to baseline.  Risks of vacuum assistance were discussed in detail, including but not limited to, bleeding, infection, damage to maternal tissues, fetal cephalohematoma, inability to effect vaginal delivery of the head or shoulder dystocia that cannot be resolved by established maneuvers and need for emergency cesarean section.  Patient gave verbal consent.  The kiwi vacuum cup was positioned and used per manufacturer's instructions.  Pulling was administered along the pelvic curve while patient was pushing; there were 2 contractions and 0 popoffs.  Vacuum was reduced in between contractions.  The infant was then delivered atraumatically, noted to be a viable female infant, Apgars of 5 and 7.  Neonatology present for delivery.  Delayed cord clamping performed, cord cut by Dr. Vergie Living. Placental cord avulsed during delivery and placenta was  extracted manually, noted to have three-vessel cord.  Second degree perineal laceration noted requiring repair with 3-0 Vicryl in the usual fashion. EBL 200, epidural anesthesia.   Sponge, instrument and needle counts were correct x 2.  The patient and baby were stable after delivery and remained in couplet care, with plans to transfer later to postpartum unit  Baby Weight: pending  Placenta: Sent to L&D Complications: retained placenta, s/p manual extraction. 2g ancef given. Placenta inspected and no evidence of retained products.  Lacerations: second degree, repaired EBL: 200 mL Analgesia: Epidural, local anesthetic    Infant:  APGAR (1 MIN): 5   APGAR (5 MINS): 7   APGAR (10 MINS):     Casper Harrison, MD [facility] Family Medicine Fellow, [facility] for [facility], [facility] 11/22/2020, 6:38 AM   Attestation of Attending Supervision of Fellow: Evaluation and management procedures were performed by the fellow under my supervision.  I have seen and examined the patient,  reviewed the fellow's note and chart, and I agree with the management and plan.   [name] MD Attending Center for [employer] Midwife)

## 2020-05-22 LAB — OB RESULTS CONSOLE ABO/RH: RH Type: POSITIVE

## 2020-05-22 LAB — HEPATITIS C ANTIBODY: HCV Ab: NEGATIVE

## 2020-05-22 LAB — OB RESULTS CONSOLE GC/CHLAMYDIA
Chlamydia: NEGATIVE
Gonorrhea: NEGATIVE

## 2020-05-22 LAB — CYTOLOGY - PAP

## 2020-05-22 LAB — OB RESULTS CONSOLE HGB/HCT, BLOOD
HCT: 37 (ref 29–41)
Hemoglobin: 12.3

## 2020-05-22 LAB — OB RESULTS CONSOLE RUBELLA ANTIBODY, IGM: Rubella: NON-IMMUNE/NOT IMMUNE

## 2020-05-22 LAB — HEMOGLOBIN EVAL RFX ELECTROPHORESIS
Drug Screen, Urine: NEGATIVE
Glucose 1 Hour: 75
Hemoglobin Evaluation: NORMAL
Urine Culture, OB: NEGATIVE

## 2020-05-22 LAB — OB RESULTS CONSOLE HIV ANTIBODY (ROUTINE TESTING): HIV: NONREACTIVE

## 2020-05-22 LAB — OB RESULTS CONSOLE PLATELET COUNT: Platelets: 273

## 2020-05-22 LAB — OB RESULTS CONSOLE HEPATITIS B SURFACE ANTIGEN: Hepatitis B Surface Ag: NEGATIVE

## 2020-05-22 LAB — OB RESULTS CONSOLE VARICELLA ZOSTER ANTIBODY, IGG: Varicella: IMMUNE

## 2020-05-22 LAB — OB RESULTS CONSOLE RPR: RPR: NONREACTIVE

## 2020-05-22 LAB — OB RESULTS CONSOLE ANTIBODY SCREEN: Antibody Screen: NEGATIVE

## 2020-05-27 ENCOUNTER — Encounter: Payer: Self-pay | Admitting: *Deleted

## 2020-05-29 ENCOUNTER — Other Ambulatory Visit: Payer: Self-pay

## 2020-05-29 ENCOUNTER — Ambulatory Visit: Payer: Self-pay | Admitting: Genetic Counselor

## 2020-05-29 ENCOUNTER — Ambulatory Visit: Payer: Self-pay

## 2020-05-29 ENCOUNTER — Ambulatory Visit: Payer: Self-pay | Attending: Obstetrics and Gynecology | Admitting: Genetic Counselor

## 2020-05-29 DIAGNOSIS — Z01812 Encounter for preprocedural laboratory examination: Secondary | ICD-10-CM | POA: Insufficient documentation

## 2020-05-29 DIAGNOSIS — O09522 Supervision of elderly multigravida, second trimester: Secondary | ICD-10-CM

## 2020-05-29 DIAGNOSIS — Z8489 Family history of other specified conditions: Secondary | ICD-10-CM

## 2020-05-29 DIAGNOSIS — Z3A14 14 weeks gestation of pregnancy: Secondary | ICD-10-CM

## 2020-05-29 NOTE — Progress Notes (Signed)
[facility] drawn per [facility], [facility].

## 2020-05-29 NOTE — Progress Notes (Signed)
05/29/2020  [name] [birth year] MRN: [id number] DOV: 05/29/2020  [name] presented to the [facility] for Maternal Fetal Care for a genetics consultation regarding advanced maternal age and her family history of a genetic disorder. [name] came to her appointment alone due to COVID-19 visitor restrictions. This session was facilitated by a [employer] Spanish interpreter [place], ID# [id number].   Indication for genetic counseling - Advanced maternal age - Family history of ataxia telangiectasia  Prenatal history  [name] is a [id number], 39 y.o. female. Her current pregnancy has completed [redacted]w[redacted]d(Estimated Date of Delivery: 11/21/20). Ms. ALippehas three children with her previous husband. This pregnancy is the first one with her current partner.   Ms. ABreuerdenied exposure to environmental toxins or chemical agents. She denied the use of alcohol, tobacco or street drugs. She reported taking prenatal vitamins. She denied significant viral illnesses, fevers, and bleeding during the course of her pregnancy. Her medical and surgical histories were noncontributory.  Family History  A three generation pedigree was drafted and reviewed. The family history is remarkable for the following:  - [name]'s husband died in an accident several years ago. Tragically, her son died by suicide at the age of 147following his father's death. Ms. AOrrisonreported that she feels that she is coping well now that some time has passed since her losses. She and her children are benefiting from therapy, and she also participates in various self-care activities. Her children are greatly looking forward to the birth of their new sibling, which has been helpful in everyone's healing process.   - [name]'s sister had two children with ataxia telangiectasia. This sister had a son who died at age 4814from leukemia, and she has a daughter with the  condition who is still living. [name]'s sister has had one miscarriage and does not have any other children. She is related to her partner, though [name] was uncertain about the degree of relation between them. See Discussion section for more details.  - [name] has a family history of diabetes. We discussed that many forms of diabetes are multifactorial in nature, occurring due to a combination of genetic, lifestyle, and environmental factors. Diabetes can appear to run in families; thus, there is a chance that the couple's children could also have diabetes.  The remaining family histories were reviewed and found to be noncontributory for birth defects, intellectual disability, recurrent pregnancy loss, and known genetic conditions. Ms. ASingleterryhad limited information about her partner's extended family history; thus, risk assessment was limited.  The patient's ethnicity is [place] The father of the pregnancy's ethnicity is [place] Ashkenazi Jewish ancestry and consanguinity were denied. Pedigree will be scanned under Media.  Discussion  AMA:  Ms. AMonsourwas referred to genetic counseling for advanced maternal age, as she will be 39years old at the time of delivery. We discussed that as a woman ages, the risk for certain chromosomal aneuploidies, such as trisomy 242(Down syndrome), trisomy 165 and trisomy 18 increases. These conditions often are not inherited, but instead occur due to an error in chromosomal division during the formation of sperm and egg cells in a process called nondisjunction. At her age and during the second trimester, [name] has approximately a 1 in 837(1.1%) chance of having a child with a chromosomal abnormality. Her age-related risk to have a child with Down syndrome specifically is 1 in 178 (0.6%) in the second trimester. We briefly reviewed features associated  with Down syndrome, trisomy 46, and trisomy 45.     We reviewed noninvasive prenatal screening (NIPS) as an available screening option for chromosomal aneuploidies. Specifically, we discussed that NIPS analyzes cell free DNA originating from the placenta that is found in the maternal blood circulation during pregnancy. This test is not diagnostic for chromosome conditions, but can provide information regarding the presence or absence of extra fetal DNA for chromosomes 13, 18 21, and the sex chromosomes. Thus, it would not identify or rule out all fetal aneuploidy. The reported detection rate is 91-99% for risomies 21, 18, 13, and sex chromosome aneuploidies. The false positive rate is reported to be less than 0.1% for any of these conditions. [name] indicated that she is interested in pursuing NIPS.    Family history of ataxia telangiectasia:  [name] has a niece and had a nephew with ataxia telangiectasia. Ataxia telangiectasia is a rare condition that affects 1 in every 40,000 to 100,000 individuals worldwide. It is a hereditary condition characterized by progressive neurologic problems that lead to difficulty walking and an increased risk of developing various types of cancer, particularly leukemia and lymphoma. Individuals with ataxia telangiectasia also have telangiectasias (dilated capillaries) that are prominent in the eyes and skin. Individuals with ataxia telangiectasia have normal intelligence, but develop slurred speech and difficulty with writing or other tasks over time. Individuals with ataxia telangiectasia also have a weakened immune system and are prone to infections. They also are particularly sensitive to ionizing radiation, such as X-rays. Features of ataxia telangiectasia present in early childhood, with most individuals requiring wheelchair assistance by adolescence. Lifespan varies, but most affected individuals live into adulthood.   Ataxia telangiectasia is caused by pathogenic variants in the ATM gene. The ATM  gene encodes for a protein that controls cell division and is involved in DNA repair. Pathogenic variants in this gene cause cells in the cerebellum to become unstable and die, leading to the neurologic symptoms of the condition. Pathogenic variants in the ATM gene also allow breaks in DNA strands to accumulate, leading to tumor formation. Ataxia telangiectasia is inherited in an autosomal recessive fashion. This means that an individual is only at risk for ataxia telangiectasia if both of their parents are carriers for the condition. If bother parents are carriers, they would have a 1 in 4 (25%) chance of being affected.  We reviewed that since Ms. Savell's sister has two children with the condition, both Ms. Severt's sister and her sister's partner are obligate carriers for ataxia telangiectasia. This means that Ms. Villescas and the rest of her siblings have a 50% chance of being a carrier for the condition themselves. We also discussed that carriers of ataxia telangiectasia have health risks of their own. Carriers for the condition are at increased risk of developing breast and pancreatic cancer. Clinical practice guidelines currently recommend that carriers of pathogenic variants in the ATM gene have more screening for breast cancer than the general population. Since Ms. Villamizar's sister is an obligate carrier for the condition, it is recommended that she be referred to an oncologist to discuss cancer screening.  We discussed the option of carrier screening for ataxia telangiectasia. If Ms. Bai were found to be a carrier for the condition, it would be recommended that her partner be screened for the condition to determine if their children have a chance of being affected by the condition. Ms. Pechacek was interested in pursuing carrier screening for ataxia telangiectasia. She informed me that she would like  to know if her children could be affected by the  condition and if she is at increased risk to develop cancer. If she is found to be a carrier, we can help facilitate referrals for cancer screening.   Carrier screening:  Per records, carrier screening for cystic fibrosis and hemoglobinopathies was likely ordered by Ms. Student's OBGYN provider. Per ACOG recommendation, carrier screening for spinal muscular atrophy (SMA) was also discussed including information about the condition, rationale for testing, autosomal recessive inheritance, and the option of prenatal diagnosis. I offered additional carrier screening for SMA, which Ms. Salway accepted at this time. Ms. Ryland was also informed that select hemoglobinopathies, CF, and SMA are included on [place]'s newborn screen.   Diagnostic testing:  Ms. Brys was also counseled regarding diagnostic testing via amniocentesis beginning at 28 weeks' gestation. We discussed the technical aspects of the procedure and quoted up to a 1 in 500 (0.2%) risk for spontaneous pregnancy loss or other adverse pregnancy outcomes as a result of amniocentesis. Cultured cells from an amniocentesis sample allow for the visualization of a fetal karyotype, which can detect >99% of chromosomal aberrations. Chromosomal microarray can also be performed to identify smaller deletions or duplications of fetal chromosomal material. Amniocentesis could also be performed to assess whether the baby is affected by ataxia telangiectasia. After careful consideration, Ms. Magowan declined amniocentesis at this time. She understands that amniocentesis is available at any point after 16 weeks of pregnancy and that she may opt to undergo the procedure at a later date should she change her mind.  Plan:  Ms. Berti had her blood drawn for NIPS and carrier screening for SMA and ataxia telangiectasia today. Results from NIPS will take approximately one week to be returned. Results from carrier  screening will take 2-3 weeks to be returned. I will call Ms. Wing once her results become available.  I counseled Ms. [place] regarding the above risks and available options. The approximate face-to-face time with the genetic counselor was 70 minutes.  In summary:  Discussed advanced maternal age and options for follow-up testing  ~1.1% chancefor chromosomal aneuploidy based on age at delivery  Opted to undergo NIPS. We will follow results  Reviewed family history concerns  History of ataxia telangiectasia in niece and nephew  Patient has a 50% chance of being a carrier for the condition  Carriers have increased risk to develop cancer  Discussed carrier screening  Carrier screening for cystic fibrosis and hemoglobinopathies reportedly ordered by OBGYN provider  Opted to undergo carrier screening for spinal muscular atrophy and ataxia telangiectasia. We will follow results  Offered additional testing and screening  Not interested in pursuing amniocentesis at 28 weeks   Buelah Manis, MS, [facility] Genetic Counselor

## 2020-06-05 ENCOUNTER — Other Ambulatory Visit: Payer: Self-pay

## 2020-06-06 ENCOUNTER — Telehealth: Payer: Self-pay | Admitting: Genetic Counselor

## 2020-06-06 NOTE — Telephone Encounter (Addendum)
I called Ms. Garfinkle with the help of a [employer] ID# [phone] to discuss her negative noninvasive prenatal screening (NIPS)/cell free DNA (cfDNA) testing result. Specifically, Ms. Ballester had NIPS through the laboratory [facility]. Testing was offered because of advanced maternal age. These negative results demonstrated an expected representation of chromosome 21, 18, 13, and sex chromosome material, greatly reducing the likelihood of trisomies 38, 73, or 50 and sex chromosome aneuploidies for the pregnancy. Ms. Mohiuddin requested to know about the expected fetal sex, which is female.  NIPS analyzes placental (fetal) DNA in maternal circulation. NIPS is considered to be highly specific and sensitive, but is not considered to be a diagnostic test. This testing identifies 95-99% of pregnancies with trisomies 21, 13, and 18, as well as sex chromosome aneuploidies, but does not test for all genetic conditions. Diagnostic testing via amniocentesis is available should she be interested in confirming this result.   I reminded Ms. Elsasser that her carrier screening for SMA and ataxia telangiectasia are still pending. These results will likely take another week or two to be returned. I will call Ms. Spohr when these results become available. She confirmed that she had no further questions at this time.   Gershon Crane, MS, [facility] Genetic Counselor

## 2020-06-19 LAB — OB RESULTS CONSOLE PLATELET COUNT: Platelets: 227

## 2020-06-19 LAB — OB RESULTS CONSOLE HGB/HCT, BLOOD
HCT: 39 (ref 29–41)
Hemoglobin: 13.2

## 2020-06-24 ENCOUNTER — Telehealth: Payer: Self-pay | Admitting: Genetic Counselor

## 2020-06-25 NOTE — Telephone Encounter (Signed)
I called Ms. Ketelsen with the help of [place], [place] [id number] to discuss results from her carrier screening. Ms. Bruning had carrier screening for spinal muscular atrophy (SMA) performed per ACOG recommendations as well as carrier screening for ataxia telangiectasia due to her family history of the condition. Results from Ms. Doby's carrier screening were negative for SMA, significantly reducing her chances of being a carrier and thus having a child affected by that condition. 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 See Genetic Counseling note from 05/29/20 for more information about ataxia telangiectasia.  We discussed that it is recommended that her partner undergo carrier screening for ataxia telangiectasia. The couple's children would have a 1 in 4 (25%) chance of being affected by ataxia telangiectasia if Ms. Glaus's partner is also a carrier. Given his ethnicity, Ms. Fletchall's partner has a 1 in 100 chance of being a carrier for ataxia telangiectasia. Thus, the couple currently has a 1 in 400 (0.25%) chance of having a child with the condition. If Ms. Seiter's partner is not identified to be a carrier, the chance of having a child affected by ataxia telangiectasia would be significantly reduced. Ms. Seyller indicated that she is interested in pursuing partner carrier screening; however, she wished to discuss this option with her partner first. She was also unsure of whether or not he has health insurance. She was informed that if he does not have insurance, he would likely qualify for free testing. We made a plan for her to call me once she has discussed testing with her partner.  Ms. Ducey was also counseled about the impact that being a carrier could have on her own health. Carriers for ataxia telangiectasia are at increased risk for developing  breast, pancreatic, and possibly other types of cancer. I offered to refer Ms. Grothaus to the [facility] to discuss her precise risks of developing cancer and available management/screening options, which Ms. Duca expressed interest in. We reviewed that since her sister is an obligate carrier for ataxia telangiectasia, she also has an increased risk to develop cancer. Additionally, each of Ms. Banning's other siblings and her parents all have a 50% chance of being carriers for ataxia telangiectasia and thus being at increased risk to develop cancer. We discussed that the genetic counselors at the [facility] can help facilitate testing/screening for any of Ms. Northcraft's other relatives that may be interested. She was also counseled that her children have a 50% chance of being carriers for ataxia telangiectasia; however, it is recommended that testing be delayed until they are 89 years of age or older and can make their own informed decision about whether or not to undergo testing of the ATM gene.  Ms. Disch confirmed that each of her questions were answered to her satisfaction today. I will await her phone call and can help to facilitate partner carrier screening if desired once I hear from her. Following our call, I contacted Roma Kayser, genetic counselor at the [facility], to place a referral for Ms. Lilja. Their schedulers will be in contact with Ms. Blaschke to get her booked for an appointment.   Buelah Manis, MS, [facility] Genetic Counselor

## 2020-06-27 ENCOUNTER — Encounter: Payer: Self-pay | Admitting: Obstetrics and Gynecology

## 2020-06-27 ENCOUNTER — Ambulatory Visit (INDEPENDENT_AMBULATORY_CARE_PROVIDER_SITE_OTHER): Payer: Self-pay | Admitting: Obstetrics and Gynecology

## 2020-06-27 ENCOUNTER — Other Ambulatory Visit: Payer: Self-pay

## 2020-06-27 ENCOUNTER — Other Ambulatory Visit (HOSPITAL_COMMUNITY)
Admission: RE | Admit: 2020-06-27 | Discharge: 2020-06-27 | Disposition: A | Discharge status: Home / Self Care | Payer: Self-pay | Source: Ambulatory Visit | Attending: Obstetrics and Gynecology | Admitting: Obstetrics and Gynecology

## 2020-06-27 VITALS — BP 133/85 | HR 67 | Wt 141.2 lb

## 2020-06-27 DIAGNOSIS — O09529 Supervision of elderly multigravida, unspecified trimester: Secondary | ICD-10-CM

## 2020-06-27 DIAGNOSIS — Z3A19 19 weeks gestation of pregnancy: Secondary | ICD-10-CM

## 2020-06-27 DIAGNOSIS — O09212 Supervision of pregnancy with history of pre-term labor, second trimester: Secondary | ICD-10-CM

## 2020-06-27 DIAGNOSIS — O09899 Supervision of other high risk pregnancies, unspecified trimester: Secondary | ICD-10-CM

## 2020-06-27 DIAGNOSIS — O0990 Supervision of high risk pregnancy, unspecified, unspecified trimester: Secondary | ICD-10-CM

## 2020-06-27 DIAGNOSIS — Z9889 Other specified postprocedural states: Secondary | ICD-10-CM

## 2020-06-27 DIAGNOSIS — O3440 Maternal care for other abnormalities of cervix, unspecified trimester: Secondary | ICD-10-CM

## 2020-06-27 DIAGNOSIS — Z603 Acculturation difficulty: Secondary | ICD-10-CM

## 2020-06-27 DIAGNOSIS — O0992 Supervision of high risk pregnancy, unspecified, second trimester: Secondary | ICD-10-CM

## 2020-06-27 DIAGNOSIS — Z789 Other specified health status: Secondary | ICD-10-CM | POA: Insufficient documentation

## 2020-06-27 DIAGNOSIS — O169 Unspecified maternal hypertension, unspecified trimester: Secondary | ICD-10-CM | POA: Insufficient documentation

## 2020-06-27 DIAGNOSIS — O3442 Maternal care for other abnormalities of cervix, second trimester: Secondary | ICD-10-CM

## 2020-06-27 DIAGNOSIS — I10 Essential (primary) hypertension: Secondary | ICD-10-CM

## 2020-06-27 DIAGNOSIS — O09522 Supervision of elderly multigravida, second trimester: Secondary | ICD-10-CM

## 2020-06-27 DIAGNOSIS — Z758 Other problems related to medical facilities and other health care: Secondary | ICD-10-CM

## 2020-06-27 DIAGNOSIS — O10912 Unspecified pre-existing hypertension complicating pregnancy, second trimester: Secondary | ICD-10-CM

## 2020-06-27 DIAGNOSIS — O285 Abnormal chromosomal and genetic finding on antenatal screening of mother: Secondary | ICD-10-CM

## 2020-06-27 NOTE — Progress Notes (Signed)
New OB Note  06/27/2020   Clinic: Center for [facility]  Chief Complaint: transfer from [facility] for [place]   History of Present Illness: Ms. Kreher is a 39 y.o. [id number] @ 19/0 weeks ([place] 12/10 [tentative], based on Patient's last menstrual period was 02/15/2020.).  Preg complicated by has Supervision of high risk pregnancy, antepartum; History of cervical LEEP biopsy affecting care of mother, antepartum; AMA (advanced maternal age) multigravida 71+; Abnormal genetic test during pregnancy; Language barrier; Chronic hypertension; and History of preterm delivery, currently pregnant on their problem list.   Patient has no complaints today  ROS: A 12-point review of systems was performed and negative, except as stated in the above HPI.  OBGYN History: As per HPI. OB History  Gravida Para Term Preterm AB Living  4 3 1 2   2   SAB TAB Ectopic Multiple Live Births          3    # Outcome Date GA Lbr Len/2nd Weight Sex Delivery Anes PTL Lv  4 Current           3 Term 04/02/08     Vag-Spont     2 Preterm 05/17/04     Vag-Spont     1 Preterm 10/15/02     Vag-Spont       Any issues with any prior pregnancies: patient states that two pregnancies were "a month early". No IOL and all spontanenous labor for all pregnancies Prior children are healthy, doing well, and without any problems or issues: yes History of pap smears: Yes. Last pap smear unknown   Past Medical History: Past Medical History:  Diagnosis Date  . Abnormal Pap smear and cervical HPV (human papillomavirus) 2011   Colpo was negative, so needs yearly pap  . Allergy   . Hypertension     Past Surgical History: Past Surgical History:  Procedure Laterality Date  . CHOLECYSTECTOMY N/A 01/28/2014   Procedure: LAPAROSCOPIC CHOLECYSTECTOMY WITH INTRAOPERATIVE CHOLANGIOGRAM;  Surgeon: Gayland Curry, MD;  Location: [facility] OR;  Service: General;  Laterality: N/A;    Family History:  Family History  Problem Relation Age  of Onset  . Diabetes Mother   . Hyperlipidemia Mother   . Hypertension Sister     Social History:  Social History   Socioeconomic History  . Marital status: Married    Spouse name: Delice Lesch  . Number of children: 3  . Years of education: 8   . Highest education level: Not on file  Occupational History    Employer: UNEMPLOYED  Tobacco Use  . Smoking status: Never Smoker  . Smokeless tobacco: Never Used  Substance and Sexual Activity  . Alcohol use: Yes  . Drug use: No  . Sexual activity: Yes    Birth control/protection: I.U.D.  Other Topics Concern  . Not on file  Social History Narrative   Spanish speaking.  Lives in [place] with husband Delice Lesch) and three children.       Finished 8th grade   Works at [employer], [place], [place], came to [place] 06/2001.  Husband is from [place].    Social Determinants of Health   Financial Resource Strain:   . Difficulty of Paying Living Expenses:   Food Insecurity:   . Worried About Charity fundraiser in the Last Year:   . Arboriculturist in the Last Year:   Transportation Needs:   . Film/video editor (Medical):   Marland Kitchen Lack of Transportation (Non-Medical):  Physical Activity:   . Days of Exercise per Week:   . Minutes of Exercise per Session:   Stress:   . Feeling of Stress :   Social Connections:   . Frequency of Communication with Friends and Family:   . Frequency of Social Gatherings with Friends and Family:   . Attends Religious Services:   . Active Member of Clubs or Organizations:   . Attends Archivist Meetings:   Marland Kitchen Marital Status:   Intimate Partner Violence:   . Fear of Current or Ex-Partner:   . Emotionally Abused:   Marland Kitchen Physically Abused:   . Sexually Abused:     Allergy: Allergies  Allergen Reactions  . Latex Rash  . Tape Rash    Health Maintenance:  Mammogram Up to Date: not applicable  Current Outpatient Medications: Prenatal vitamin Asa 81  qday Labetalol 200 bid  Physical Exam:   BP 133/85   Pulse 67   Wt 141 lb 3.2 oz (64 kg)   LMP 02/15/2020   BMI 29.51 kg/m  Body mass index is 29.51 kg/m. Contractions: Not present Vag. Bleeding: None. Fundal height: 19 FHTs: 140s  General appearance: Well nourished, well developed female in no acute distress.  Respiratory:   Normal respiratory effort Abdomen: positive bowel sounds and no masses, hernias; diffusely non tender to palpation, non distended Neuro/Psych:  Normal mood and affect.  Skin:  Warm and dry.  Lymphatic:  No inguinal lymphadenopathy.   Pelvic exam: is not limited by body habitus EGBUS: within normal limits, Vagina: within normal limits and with no blood in the vault, Cervix: normal appearing cervix without discharge or lesions, closed/long/high, Uterus:  enlarged: 20, and Adnexa:  normal adnexa and no mass, fullness, tenderness  Laboratory: 1h GTT normal  Imaging:  None. Patient states she has not had an u/s yet this pregnancy  Assessment: pt stable  Plan: 1. Supervision of high risk pregnancy, antepartum Routine care. Pap smear obtained. If we can't get records then will send it - [place] MFM OB DETAIL +14 WK; Future - Cytology - PAP( [place]) - AFP, Serum, Open Spina Bifida - Comprehensive metabolic panel - Protein / creatinine ratio, urine  2. Antepartum multigravida of advanced maternal age Seen by GC - AFP, Serum, Open Spina Bifida  3. 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 Seen by GC  4. History of cervical LEEP biopsy affecting care of mother, antepartum Normal exam today. F/u CL at anatomy u/s - [place] MFM OB DETAIL +14 WK; Future  5. Language barrier Interpreter used  6. Chronic hypertension Continue asa and labetalol  7. History of preterm delivery, currently pregnant Unsure if this was truly preterm or not as we don't have records. D/w her that I don't recommend 17p and she is fine with this.   Problem list  reviewed and updated.  Follow up in 3 weeks.  The nature of [place] with multiple MDs and other Advanced Practice Providers was explained to patient; also emphasized that residents, students are part of our team.  >50% of 30 min visit spent on counseling and coordination of care.     Durene Romans MD Attending Center for [facility])

## 2020-06-28 LAB — PROTEIN / CREATININE RATIO, URINE
Creatinine, Urine: 145.5 mg/dL
Protein, Ur: 19.9 mg/dL
Protein/Creat Ratio: 137 mg/g creat (ref 0–200)

## 2020-06-29 LAB — COMPREHENSIVE METABOLIC PANEL
ALT: 40 IU/L — ABNORMAL HIGH (ref 0–32)
AST: 26 IU/L (ref 0–40)
Albumin/Globulin Ratio: 1.6 (ref 1.2–2.2)
Albumin: 3.8 g/dL (ref 3.8–4.8)
Alkaline Phosphatase: 78 IU/L (ref 48–121)
BUN/Creatinine Ratio: 28 — ABNORMAL HIGH (ref 9–23)
BUN: 14 mg/dL (ref 6–20)
Bilirubin Total: 0.3 mg/dL (ref 0.0–1.2)
CO2: 20 mmol/L (ref 20–29)
Calcium: 9.3 mg/dL (ref 8.7–10.2)
Chloride: 104 mmol/L (ref 96–106)
Creatinine, Ser: 0.5 mg/dL — ABNORMAL LOW (ref 0.57–1.00)
GFR calc Af Amer: 143 mL/min/{1.73_m2} (ref >59)
GFR calc non Af Amer: 124 mL/min/{1.73_m2} (ref >59)
Globulin, Total: 2.4 g/dL (ref 1.5–4.5)
Glucose: 86 mg/dL (ref 65–99)
Potassium: 4.8 mmol/L (ref 3.5–5.2)
Sodium: 138 mmol/L (ref 134–144)
Total Protein: 6.2 g/dL (ref 6.0–8.5)

## 2020-06-29 LAB — AFP, SERUM, OPEN SPINA BIFIDA
AFP MoM: 1.57
AFP Value: 83.3 ng/mL
Gest. Age on Collection Date: 19 weeks
Maternal Age At EDD: 37.7 yr
OSBR Risk 1 IN: 2270
Test Results:: NEGATIVE
Weight: 141 [lb_av]

## 2020-06-30 ENCOUNTER — Encounter: Payer: Self-pay | Admitting: Obstetrics and Gynecology

## 2020-06-30 DIAGNOSIS — R7401 Elevation of levels of liver transaminase levels: Secondary | ICD-10-CM | POA: Insufficient documentation

## 2020-07-02 LAB — CYTOLOGY - PAP
Adequacy: ABSENT
Chlamydia: NEGATIVE
Comment: NEGATIVE
Comment: NEGATIVE
Comment: NEGATIVE
Comment: NORMAL
Diagnosis: NEGATIVE
High risk HPV: NEGATIVE
Neisseria Gonorrhea: NEGATIVE
Trichomonas: NEGATIVE

## 2020-07-04 ENCOUNTER — Ambulatory Visit: Payer: Self-pay | Admitting: *Deleted

## 2020-07-04 ENCOUNTER — Other Ambulatory Visit: Payer: Self-pay | Admitting: *Deleted

## 2020-07-04 ENCOUNTER — Ambulatory Visit: Payer: Self-pay | Attending: Obstetrics and Gynecology

## 2020-07-04 ENCOUNTER — Other Ambulatory Visit: Payer: Self-pay

## 2020-07-04 VITALS — BP 129/86 | HR 71

## 2020-07-04 DIAGNOSIS — O10012 Pre-existing essential hypertension complicating pregnancy, second trimester: Secondary | ICD-10-CM

## 2020-07-04 DIAGNOSIS — O09522 Supervision of elderly multigravida, second trimester: Secondary | ICD-10-CM | POA: Insufficient documentation

## 2020-07-04 DIAGNOSIS — Z363 Encounter for antenatal screening for malformations: Secondary | ICD-10-CM

## 2020-07-04 DIAGNOSIS — Z362 Encounter for other antenatal screening follow-up: Secondary | ICD-10-CM

## 2020-07-04 DIAGNOSIS — O09212 Supervision of pregnancy with history of pre-term labor, second trimester: Secondary | ICD-10-CM

## 2020-07-04 DIAGNOSIS — O3440 Maternal care for other abnormalities of cervix, unspecified trimester: Secondary | ICD-10-CM | POA: Insufficient documentation

## 2020-07-04 DIAGNOSIS — Z3A17 17 weeks gestation of pregnancy: Secondary | ICD-10-CM

## 2020-07-04 DIAGNOSIS — Z9889 Other specified postprocedural states: Secondary | ICD-10-CM | POA: Insufficient documentation

## 2020-07-04 DIAGNOSIS — O0990 Supervision of high risk pregnancy, unspecified, unspecified trimester: Secondary | ICD-10-CM | POA: Insufficient documentation

## 2020-07-07 ENCOUNTER — Telehealth: Payer: Self-pay | Admitting: *Deleted

## 2020-07-07 ENCOUNTER — Telehealth (INDEPENDENT_AMBULATORY_CARE_PROVIDER_SITE_OTHER): Payer: Self-pay

## 2020-07-07 DIAGNOSIS — Z760 Encounter for issue of repeat prescription: Secondary | ICD-10-CM

## 2020-07-07 NOTE — Telephone Encounter (Signed)
Prenatal navigator Renea Ee reported pt needs refill for aspirin 81 mg. Called pt with [facility] interpreter Nichelle ID [phone]. Explained this medication can be purchased over the counter, pt does not have insurance coverage. Pt verbalizes understanding and would prefer to buy over the counter.

## 2020-07-07 NOTE — Telephone Encounter (Addendum)
-----   Message from [place] Bing, MD sent at 07/02/2020 12:21 PM EDT ----- Everything looks good. Can you just let her know to repeat her pap in one year.   7/26  1400 Called pt w/ [facility] interpreter [phone] and informed her of normal Pap result. She will need repeat Pap in one year. Pt voiced understanding and had no questions.

## 2020-07-10 ENCOUNTER — Encounter: Payer: Self-pay | Admitting: *Deleted

## 2020-07-10 ENCOUNTER — Encounter: Payer: Self-pay | Admitting: Obstetrics and Gynecology

## 2020-07-10 DIAGNOSIS — O0990 Supervision of high risk pregnancy, unspecified, unspecified trimester: Secondary | ICD-10-CM

## 2020-07-14 ENCOUNTER — Encounter: Payer: Self-pay | Admitting: *Deleted

## 2020-07-14 DIAGNOSIS — Z658 Other specified problems related to psychosocial circumstances: Secondary | ICD-10-CM | POA: Insufficient documentation

## 2020-07-14 DIAGNOSIS — O0990 Supervision of high risk pregnancy, unspecified, unspecified trimester: Secondary | ICD-10-CM

## 2020-07-17 ENCOUNTER — Ambulatory Visit (INDEPENDENT_AMBULATORY_CARE_PROVIDER_SITE_OTHER): Payer: Self-pay | Admitting: Obstetrics and Gynecology

## 2020-07-17 ENCOUNTER — Other Ambulatory Visit: Payer: Self-pay

## 2020-07-17 VITALS — BP 129/68 | HR 80

## 2020-07-17 DIAGNOSIS — Z758 Other problems related to medical facilities and other health care: Secondary | ICD-10-CM

## 2020-07-17 DIAGNOSIS — Z789 Other specified health status: Secondary | ICD-10-CM

## 2020-07-17 DIAGNOSIS — I10 Essential (primary) hypertension: Secondary | ICD-10-CM

## 2020-07-17 DIAGNOSIS — O0990 Supervision of high risk pregnancy, unspecified, unspecified trimester: Secondary | ICD-10-CM

## 2020-07-17 MED ORDER — ASPIRIN 81 MG PO CHEW: 81.0000 mg | CHEWABLE_TABLET | Freq: Every day | ORAL | 1 refills | Status: DC

## 2020-07-17 MED ORDER — LABETALOL HCL 200 MG PO TABS: 200.0000 mg | ORAL_TABLET | Freq: Two times a day (BID) | ORAL | 3 refills | Status: DC

## 2020-07-17 NOTE — Progress Notes (Signed)
Prenatal Visit Note Date: 07/17/2020 [facility]  Subjective:  [name] is a 39 y.o. [id number] at [redacted]w[redacted]d being seen today for ongoing prenatal care.  She is currently monitored for the following issues for this high-risk pregnancy and has Supervision of high risk pregnancy, antepartum; History of cervical LEEP biopsy affecting care of mother, antepartum; AMA (advanced maternal age) multigravida 35+; Abnormal genetic test during pregnancy; Language barrier; Chronic hypertension; History of preterm delivery, currently pregnant; Elevated ALT measurement; and Psychosocial distress on their problem list.  Patient reports no complaints.   Contractions: Not present. Vag. Bleeding: None.  Movement: Present. Denies leaking of fluid.   The following portions of the patient's history were reviewed and updated as appropriate: allergies, current medications, past family history, past medical history, past social history, past surgical history and problem list. Problem list updated.  Objective:   Vitals:   07/17/20 0950  BP: 129/68  Pulse: 80    Fetal Status: Fetal Heart Rate (bpm): 155   Movement: Present     General:  Alert, oriented and cooperative. Patient is in no acute distress.  Skin: Skin is warm and dry. No rash noted.   Cardiovascular: Normal heart rate noted  Respiratory: Normal respiratory effort, no problems with respiration noted  Abdomen: Soft, gravid, appropriate for gestational age. Pain/Pressure: Absent     Pelvic:  Cervical exam deferred        Extremities: Normal range of motion.  Edema: None  Mental Status: Normal mood and affect. Normal behavior. Normal judgment and thought content.   Urinalysis:      Assessment and Plan:  Pregnancy: [id number] at [redacted]w[redacted]d  1. Supervision of high risk pregnancy, antepartum Routine care. Normal anatomy u/s. I told her I recommend repeat pap at 4-6 week PP visit. I d/w her re: her edc change  2. Chronic  hypertension Continue labetalol 200 bid and low dose asa. Refills sent   3. Language barrier interpreter used  Preterm labor symptoms and general obstetric precautions including but not limited to vaginal bleeding, contractions, leaking of fluid and fetal movement were reviewed in detail with the patient. Please refer to After Visit Summary for other counseling recommendations.  Return in about 15 days (around 08/01/2020) for in person, high risk.   [place] Bing, MD

## 2020-07-17 NOTE — Progress Notes (Signed)
Spanish Interpreter Eda R.,

## 2020-07-17 NOTE — Patient Instructions (Signed)
Your due date is 12/29  Today you are 19 weeks and 1 day.

## 2020-08-01 ENCOUNTER — Ambulatory Visit: Payer: Self-pay | Attending: Obstetrics and Gynecology

## 2020-08-01 ENCOUNTER — Other Ambulatory Visit: Payer: Self-pay

## 2020-08-01 ENCOUNTER — Ambulatory Visit: Payer: Self-pay | Admitting: *Deleted

## 2020-08-01 ENCOUNTER — Other Ambulatory Visit: Payer: Self-pay | Admitting: *Deleted

## 2020-08-01 DIAGNOSIS — O10012 Pre-existing essential hypertension complicating pregnancy, second trimester: Secondary | ICD-10-CM

## 2020-08-01 DIAGNOSIS — O0990 Supervision of high risk pregnancy, unspecified, unspecified trimester: Secondary | ICD-10-CM | POA: Insufficient documentation

## 2020-08-01 DIAGNOSIS — O09529 Supervision of elderly multigravida, unspecified trimester: Secondary | ICD-10-CM

## 2020-08-01 DIAGNOSIS — Z3A21 21 weeks gestation of pregnancy: Secondary | ICD-10-CM

## 2020-08-01 DIAGNOSIS — Z658 Other specified problems related to psychosocial circumstances: Secondary | ICD-10-CM | POA: Insufficient documentation

## 2020-08-01 DIAGNOSIS — Z362 Encounter for other antenatal screening follow-up: Secondary | ICD-10-CM

## 2020-08-01 DIAGNOSIS — O09212 Supervision of pregnancy with history of pre-term labor, second trimester: Secondary | ICD-10-CM

## 2020-08-01 DIAGNOSIS — O09522 Supervision of elderly multigravida, second trimester: Secondary | ICD-10-CM

## 2020-08-07 ENCOUNTER — Encounter: Payer: Self-pay | Admitting: Obstetrics and Gynecology

## 2020-08-14 ENCOUNTER — Other Ambulatory Visit: Payer: Self-pay

## 2020-08-14 ENCOUNTER — Ambulatory Visit (INDEPENDENT_AMBULATORY_CARE_PROVIDER_SITE_OTHER): Payer: Self-pay | Admitting: Obstetrics and Gynecology

## 2020-08-14 ENCOUNTER — Encounter: Payer: Self-pay | Admitting: Obstetrics and Gynecology

## 2020-08-14 VITALS — BP 122/69 | HR 71 | Wt 153.3 lb

## 2020-08-14 DIAGNOSIS — Z3A23 23 weeks gestation of pregnancy: Secondary | ICD-10-CM

## 2020-08-14 DIAGNOSIS — R7401 Elevation of levels of liver transaminase levels: Secondary | ICD-10-CM

## 2020-08-14 DIAGNOSIS — O0990 Supervision of high risk pregnancy, unspecified, unspecified trimester: Secondary | ICD-10-CM

## 2020-08-14 DIAGNOSIS — O285 Abnormal chromosomal and genetic finding on antenatal screening of mother: Secondary | ICD-10-CM

## 2020-08-14 DIAGNOSIS — I10 Essential (primary) hypertension: Secondary | ICD-10-CM

## 2020-08-14 DIAGNOSIS — Z9189 Other specified personal risk factors, not elsewhere classified: Secondary | ICD-10-CM

## 2020-08-14 DIAGNOSIS — O10012 Pre-existing essential hypertension complicating pregnancy, second trimester: Secondary | ICD-10-CM

## 2020-08-14 DIAGNOSIS — O9A512 Psychological abuse complicating pregnancy, second trimester: Secondary | ICD-10-CM

## 2020-08-14 DIAGNOSIS — O3440 Maternal care for other abnormalities of cervix, unspecified trimester: Secondary | ICD-10-CM

## 2020-08-14 DIAGNOSIS — O09899 Supervision of other high risk pregnancies, unspecified trimester: Secondary | ICD-10-CM

## 2020-08-14 DIAGNOSIS — O09522 Supervision of elderly multigravida, second trimester: Secondary | ICD-10-CM

## 2020-08-14 DIAGNOSIS — T7491XA Unspecified adult maltreatment, confirmed, initial encounter: Secondary | ICD-10-CM

## 2020-08-14 DIAGNOSIS — Z789 Other specified health status: Secondary | ICD-10-CM

## 2020-08-14 NOTE — Progress Notes (Signed)
Spanish Interpreter Eda R. 
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
## 2020-08-14 NOTE — Patient Instructions (Addendum)
Segundo trimestre de embarazo Second Trimester of Pregnancy  El segundo trimestre va desde la semana14 hasta la 27 (desde el mes 4 hasta el 6). Este suele ser el momento en el que mejor se siente. En general, las nuseas matutinas han disminuido o han desaparecido completamente. Tendr ms energa y podr aumentarle el apetito. El beb en gestacin se desarrolla rpidamente. Hacia el final del sexto mes, el beb mide aproximadamente 9 pulgadas (23 cm) y pesa alrededor de 1 libras (700 g). Es probable que sienta al beb moverse entre las 18 y 20 semanas del embarazo. Siga estas indicaciones en su casa: Medicamentos  Tome los medicamentos de venta libre y los recetados solamente como se lo haya indicado el mdico. Algunos medicamentos son seguros para tomar durante el embarazo y otros no lo son.  Tome vitaminas prenatales que contengan por lo menos 600microgramos (?g) de cido flico.  Si tiene dificultad para mover el intestino (estreimiento), tome un medicamento para ablandar las heces (laxante) si su mdico se lo autoriza. Comida y bebida   Ingiera alimentos saludables de manera regular.  No coma carne cruda ni quesos sin cocinar.  Si obtiene poca cantidad de calcio de los alimentos que ingiere, consulte a su mdico sobre la posibilidad de tomar un suplemento diario de calcio.  Evite el consumo de alimentos ricos en grasas y azcares, como los alimentos fritos y los dulces.  Si tiene malestar estomacal (nuseas) o devuelve (vomita): ? Ingiera 4 o 5comidas pequeas por da en lugar de 3abundantes. ? Intente comer algunas galletitas saladas. ? Beba lquidos entre las comidas, en lugar de hacerlo durante estas.  Para evitar el estreimiento: ? Consuma alimentos ricos en fibra, como frutas y verduras frescas, cereales integrales y frijoles. ? Beba suficiente lquido para mantener el pis (orina) claro o de color amarillo plido. Actividad  Haga ejercicios solamente como se lo haya  indicado el mdico. Interrumpa la actividad fsica si comienza a tener calambres.  No haga ejercicio si hace demasiado calor, hay demasiada humedad o se encuentra en un lugar de mucha altura (altitud alta).  Evite levantar pesos excesivos.  Use zapatos con tacones bajos. Mantenga una buena postura al sentarse y pararse.  Puede continuar teniendo relaciones sexuales, a menos que el mdico le indique lo contrario. Alivio del dolor y del malestar  Use un sostn que le brinde buen soporte si sus mamas estn sensibles.  Dese baos de asiento con agua tibia para aliviar el dolor o las molestias causadas por las hemorroides. Use una crema para las hemorroides si el mdico la autoriza.  Descanse con las piernas elevadas si tiene calambres o dolor de cintura.  Si desarrolla venas hinchadas y abultadas (vrices) en las piernas: ? Use medias de compresin o medias de descanso como se lo haya indicado el mdico. ? Levante (eleve) los pies durante 15minutos, 3 o 4veces por da. ? Limite el consumo de sal en sus alimentos. Cuidado prenatal  Escriba sus preguntas. Llvelas cuando concurra a las visitas prenatales.  Concurra a todas las visitas prenatales como se lo haya indicado el mdico. Esto es importante. Seguridad  Colquese el cinturn de seguridad cuando conduzca.  Haga una lista de los nmeros de telfono de emergencia, que incluya los nmeros de telfono de familiares, amigos, el hospital, as como los departamentos de polica y bomberos. Instrucciones generales  Consulte a su mdico sobre los alimentos que debe comer o pdale que la ayude a encontrar a quien pueda aconsejarla si necesita ese servicio.    Consulte a su mdico acerca de dnde se dictan clases prenatales cerca de donde vive. Comience las clases antes del mes 6 de embarazo.  No se d baos de inmersin en agua caliente, baos turcos ni saunas.  No se haga duchas vaginales ni use tampones o toallas higinicas  perfumadas.  No mantenga las piernas cruzadas durante [place].  Vaya al dentista si an no lo hizo. Use un cepillo de cerdas suaves para cepillarse los dientes. Psese el hilo dental suavemente.  No fume, no consuma hierbas ni beba alcohol. No tome frmacos que el mdico no haya autorizado.  No consuma ningn producto que contenga nicotina o tabaco, como cigarrillos y Administrator, Civil Service. Si necesita ayuda para dejar de fumar, consulte al mdico.  Evite el contacto con las bandejas sanitarias de los gatos y la tierra que estos animales usan. Estos elementos contienen bacterias que pueden causar defectos congnitos al beb y la posible prdida del beb (aborto espontneo) o la muerte fetal. Comunquese con un mdico si:  Tiene clicos leves o siente presin en la parte baja del vientre.  Tiene dolor al hacer pis (orinar).  Advierte un lquido con olor ftido que proviene de la vagina.  Tiene Programme researcher, broadcasting/film/video (nuseas), devuelve (vomita) o tiene deposiciones acuosas (diarrea).  Sufre un dolor persistente en el abdomen.  Siente mareos. Solicite ayuda de inmediato si:  Tiene fiebre.  Tiene una prdida de lquido por la vagina.  Tiene sangrado o pequeas prdidas vaginales.  Siente dolor intenso o clicos en el abdomen.  Sube o baja de peso rpidamente.  Tiene dificultades para recuperar el aliento y siente dolor en el pecho.  Sbitamente se le hinchan mucho el rostro, las [place], los tobillos, los pies o las piernas.  No ha sentido los movimientos del beb durante Georgianne Fick.  Siente un dolor de cabeza intenso que no se alivia al tomar [employer].  Tiene dificultad para ver. Resumen  El segundo trimestre va desde la semana14 hasta la 27, desde el mes 4 hasta el 6. Este suele ser el momento en el que mejor se siente.  Para cuidarse y cuidar a su beb en gestacin, debe comer alimentos saludables, tomar medicamentos solamente si su mdico le indica que lo haga y  hacer actividades que sean seguras para usted y su beb.  Llame al mdico si se enferma o si nota algo inusual acerca de su embarazo. Tambin llame al mdico si necesita ayuda para saber qu alimentos debe comer o si quiere saber qu actividades puede realizar de forma segura. Esta informacin no tiene Theme park manager el consejo del mdico. Asegrese de hacerle al mdico cualquier pregunta que tenga. Document Revised: 08/24/2017 Document Reviewed: 08/24/2017 Elsevier Patient Education  2020 Elsevier Inc.   [facility]:  [address], 2nd floor, [place], [place] [address] [phone])  Main line [phone]  [facility] location  [facility]:  [address], [place], [place] [address] [phone]) Main line [phone]  [place] location   Walk-in hours: Monday -Friday 8:30am-4:30am  [email or web address]   If immediate emergency, call 9-1-1, or [facility] the [place] 24 hour crisis hotline at: [phone]

## 2020-08-19 NOTE — BH Specialist Note (Signed)
Integrated Behavioral Health via Telemedicine Video (Caregility) Visit  08/19/2020 [name] [id number]  Number of Integrated Behavioral Health visits: 1 (3 total) Session Start time: 10:19  Session End time: 10:50 Total time: 31 minutes  Referring Provider: Nettie Elm, MD Type of Service: Individual, Family, Etc. Patient/Family location: Home [facility] Provider location: Center for [employer] [facility]  All persons participating in visit: Patient [name] and [facility] [name] and interpreter [name] [phone]   Discussed confidentiality: Yes   I connected with [name]  by a video enabled telemedicine application (Caregility) and verified that I am speaking with the correct person using two identifiers.    I discussed that engaging in this virtual visit, they consent to the provision of behavioral healthcare and the services will be billed under their insurance.   Patient and/or legal guardian expressed understanding and consented to virtual visit: Yes   PRESENTING CONCERNS: Patient and/or family reports the following symptoms/concerns: Pt states her primary concern today is worry over her teenage son's untreated BH issues.   Duration of problem: Increase over time ; Severity of problem: moderate   STRENGTHS (Protective Factors/Coping Skills): Supportive family  ASSESSMENT:  Patient currently experiencing Psychosocial stress.    GOALS ADDRESSED: Patient will: 1.  Reduce symptoms of: stress  2.  Increase knowledge and/or ability of: stress reduction  3.  Demonstrate ability to: Increase healthy adjustment to current life circumstances and Increase adequate support systems for patient/family  Progress of Goals: Ongoing  INTERVENTIONS: Interventions utilized:  Solution-Focused Strategies, Psychoeducation and/or Health Education and Link to [employer] Assessments completed &  reviewed: PHQ9/GAD7 given in past two weeks   OUTCOME: Patient Response: Pt agrees to following plan   PLAN: 1. Follow up with behavioral health clinician on : As needed 2. Behavioral recommendations:  -Consider registering for and attending online perinatal support group at [email or web address] [facility]) -Consider requesting son's PCP send in referral to psychiatry; consider using [facility] for urgent care behavioral health needs for son, if/when he agrees  3. Referral(s): Integrated Art gallery manager (In Clinic) and Community Resources:  [facility] services for son/ Perinatal support group  I discussed the assessment and treatment plan with the patient and/or parent/guardian. They were provided an opportunity to ask questions and all were answered. They agreed with the plan and demonstrated an understanding of the instructions.   They were advised to call back or seek an in-person evaluation if the symptoms worsen or if the condition fails to improve as anticipated.   Confirmed patient's address: Yes  Confirmed patient's phone number: Yes  Any changes to demographics: No   Confirmed patient's insurance: Yes  Any changes to patient's insurance: No   I discussed the limitations of evaluation and management by telemedicine and the availability of in person appointments.  I discussed that the purpose of this visit is to provide behavioral health care while limiting exposure to the novel coronavirus.   Discussed there is a possibility of technology failure and discussed alternative modes of communication if that failure occurs.  Valetta Close [facility]  Depression screen [facility] 2/9 08/14/2020 07/17/2020 06/30/2020 04/25/2017 11/25/2016  Decreased Interest 0 0 2 0 2  Down, Depressed, Hopeless 0 0 2 1 2   PHQ - 2 Score 0 0 4 1 4   Altered sleeping 0 0 1 0 2  Tired, decreased energy 1 0 1 0 2  Change in appetite 0 0 2 1 2  Feeling bad or failure about yourself  0  0 0 1 2  Trouble concentrating 0 0 2 0 2  Moving slowly or fidgety/restless 0 0 2 0 2  Suicidal thoughts 0 0 0 0 1  PHQ-9 Score 1 0 12 3 17   ' GAD 7 : Generalized Anxiety Score 07/17/2020 06/30/2020 04/25/2017 10/25/2016  Nervous, Anxious, on Edge 0 0 0 1  Control/stop worrying 0 0 1 1  Worry too much - different things 0 0 1 2  Trouble relaxing 0 0 0 1  Restless 0 1 0 1  Easily annoyed or irritable 0 0 1 2  Afraid - awful might happen 0 0 2 3  Total GAD 7 Score 0 1 5 11

## 2020-08-21 ENCOUNTER — Ambulatory Visit (INDEPENDENT_AMBULATORY_CARE_PROVIDER_SITE_OTHER): Payer: Self-pay | Admitting: Clinical

## 2020-08-21 DIAGNOSIS — T7491XA Unspecified adult maltreatment, confirmed, initial encounter: Secondary | ICD-10-CM

## 2020-08-21 DIAGNOSIS — Z733 Stress, not elsewhere classified: Secondary | ICD-10-CM

## 2020-08-29 ENCOUNTER — Other Ambulatory Visit: Payer: Self-pay

## 2020-08-29 ENCOUNTER — Ambulatory Visit: Payer: Self-pay | Admitting: *Deleted

## 2020-08-29 ENCOUNTER — Other Ambulatory Visit: Payer: Self-pay | Admitting: *Deleted

## 2020-08-29 ENCOUNTER — Encounter: Payer: Self-pay | Admitting: *Deleted

## 2020-08-29 ENCOUNTER — Ambulatory Visit: Payer: Self-pay | Attending: Obstetrics and Gynecology

## 2020-08-29 DIAGNOSIS — O09212 Supervision of pregnancy with history of pre-term labor, second trimester: Secondary | ICD-10-CM

## 2020-08-29 DIAGNOSIS — O10919 Unspecified pre-existing hypertension complicating pregnancy, unspecified trimester: Secondary | ICD-10-CM

## 2020-08-29 DIAGNOSIS — O321XX0 Maternal care for breech presentation, not applicable or unspecified: Secondary | ICD-10-CM

## 2020-08-29 DIAGNOSIS — Z362 Encounter for other antenatal screening follow-up: Secondary | ICD-10-CM

## 2020-08-29 DIAGNOSIS — O0990 Supervision of high risk pregnancy, unspecified, unspecified trimester: Secondary | ICD-10-CM | POA: Insufficient documentation

## 2020-08-29 DIAGNOSIS — O10912 Unspecified pre-existing hypertension complicating pregnancy, second trimester: Secondary | ICD-10-CM

## 2020-08-29 DIAGNOSIS — O09522 Supervision of elderly multigravida, second trimester: Secondary | ICD-10-CM

## 2020-08-29 DIAGNOSIS — O09529 Supervision of elderly multigravida, unspecified trimester: Secondary | ICD-10-CM | POA: Insufficient documentation

## 2020-08-29 DIAGNOSIS — Z3A25 25 weeks gestation of pregnancy: Secondary | ICD-10-CM

## 2020-08-29 DIAGNOSIS — Z658 Other specified problems related to psychosocial circumstances: Secondary | ICD-10-CM | POA: Insufficient documentation

## 2020-09-03 ENCOUNTER — Other Ambulatory Visit: Payer: Self-pay | Admitting: *Deleted

## 2020-09-03 DIAGNOSIS — O10919 Unspecified pre-existing hypertension complicating pregnancy, unspecified trimester: Secondary | ICD-10-CM

## 2020-09-08 ENCOUNTER — Other Ambulatory Visit: Payer: Self-pay | Admitting: General Practice

## 2020-09-08 DIAGNOSIS — O0990 Supervision of high risk pregnancy, unspecified, unspecified trimester: Secondary | ICD-10-CM

## 2020-09-11 ENCOUNTER — Other Ambulatory Visit: Payer: Self-pay

## 2020-09-11 ENCOUNTER — Other Ambulatory Visit: Payer: Self-pay | Admitting: Obstetrics and Gynecology

## 2020-09-11 ENCOUNTER — Ambulatory Visit (INDEPENDENT_AMBULATORY_CARE_PROVIDER_SITE_OTHER): Payer: Self-pay | Admitting: Obstetrics and Gynecology

## 2020-09-11 ENCOUNTER — Ambulatory Visit: Payer: Self-pay | Attending: Obstetrics and Gynecology

## 2020-09-11 ENCOUNTER — Encounter: Payer: Self-pay | Admitting: Obstetrics and Gynecology

## 2020-09-11 ENCOUNTER — Ambulatory Visit: Payer: Self-pay | Admitting: *Deleted

## 2020-09-11 VITALS — BP 131/86 | HR 81 | Wt 160.5 lb

## 2020-09-11 DIAGNOSIS — Z23 Encounter for immunization: Secondary | ICD-10-CM

## 2020-09-11 DIAGNOSIS — O09899 Supervision of other high risk pregnancies, unspecified trimester: Secondary | ICD-10-CM

## 2020-09-11 DIAGNOSIS — O0990 Supervision of high risk pregnancy, unspecified, unspecified trimester: Secondary | ICD-10-CM

## 2020-09-11 DIAGNOSIS — O3440 Maternal care for other abnormalities of cervix, unspecified trimester: Secondary | ICD-10-CM

## 2020-09-11 DIAGNOSIS — Z9889 Other specified postprocedural states: Secondary | ICD-10-CM

## 2020-09-11 DIAGNOSIS — Z658 Other specified problems related to psychosocial circumstances: Secondary | ICD-10-CM | POA: Insufficient documentation

## 2020-09-11 DIAGNOSIS — O3442 Maternal care for other abnormalities of cervix, second trimester: Secondary | ICD-10-CM

## 2020-09-11 DIAGNOSIS — O285 Abnormal chromosomal and genetic finding on antenatal screening of mother: Secondary | ICD-10-CM

## 2020-09-11 DIAGNOSIS — I10 Essential (primary) hypertension: Secondary | ICD-10-CM

## 2020-09-11 DIAGNOSIS — O10912 Unspecified pre-existing hypertension complicating pregnancy, second trimester: Secondary | ICD-10-CM

## 2020-09-11 DIAGNOSIS — O09212 Supervision of pregnancy with history of pre-term labor, second trimester: Secondary | ICD-10-CM

## 2020-09-11 DIAGNOSIS — O09522 Supervision of elderly multigravida, second trimester: Secondary | ICD-10-CM

## 2020-09-11 DIAGNOSIS — Z3A27 27 weeks gestation of pregnancy: Secondary | ICD-10-CM

## 2020-09-11 DIAGNOSIS — O10919 Unspecified pre-existing hypertension complicating pregnancy, unspecified trimester: Secondary | ICD-10-CM | POA: Insufficient documentation

## 2020-09-11 DIAGNOSIS — R7401 Elevation of levels of liver transaminase levels: Secondary | ICD-10-CM

## 2020-09-11 MED ORDER — ONDANSETRON 4 MG PO TBDP: 4.0000 mg | ORAL_TABLET | Freq: Four times a day (QID) | ORAL | 0 refills | Status: DC | PRN
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
## 2020-09-11 NOTE — Patient Instructions (Signed)
Third Trimester of Pregnancy The third trimester is from week 28 through week 40 (months 7 through 9). The third trimester is a time when the unborn baby (fetus) is growing rapidly. At the end of the ninth month, the fetus is about 20 inches in length and weighs 6-10 pounds. Body changes during your third trimester Your body will continue to go through many changes during pregnancy. The changes vary from woman to woman. During the third trimester:  Your weight will continue to increase. You can expect to gain 25-35 pounds (11-16 kg) by the end of the pregnancy.  You may begin to get stretch marks on your hips, abdomen, and breasts.  You may urinate more often because the fetus is moving lower into your pelvis and pressing on your bladder.  You may develop or continue to have heartburn. This is caused by increased hormones that slow down muscles in the digestive tract.  You may develop or continue to have constipation because increased hormones slow digestion and cause the muscles that push waste through your intestines to relax.  You may develop hemorrhoids. These are swollen veins (varicose veins) in the rectum that can itch or be painful.  You may develop swollen, bulging veins (varicose veins) in your legs.  You may have increased body aches in the pelvis, back, or thighs. This is due to weight gain and increased hormones that are relaxing your joints.  You may have changes in your hair. These can include thickening of your hair, rapid growth, and changes in texture. Some women also have hair loss during or after pregnancy, or hair that feels dry or thin. Your hair will most likely return to normal after your baby is born.  Your breasts will continue to grow and they will continue to become tender. A yellow fluid (colostrum) may leak from your breasts. This is the first milk you are producing for your baby.  Your belly button may stick out.  You may notice more swelling in your hands,  face, or ankles.  You may have increased tingling or numbness in your hands, arms, and legs. The skin on your belly may also feel numb.  You may feel short of breath because of your expanding uterus.  You may have more problems sleeping. This can be caused by the size of your belly, increased need to urinate, and an increase in your body's metabolism.  You may notice the fetus "dropping," or moving lower in your abdomen (lightening).  You may have increased vaginal discharge.  You may notice your joints feel loose and you may have pain around your pelvic bone. What to expect at prenatal visits You will have prenatal exams every 2 weeks until week 36. Then you will have weekly prenatal exams. During a routine prenatal visit:  You will be weighed to make sure you and the baby are growing normally.  Your blood pressure will be taken.  Your abdomen will be measured to track your baby's growth.  The fetal heartbeat will be listened to.  Any test results from the previous visit will be discussed.  You may have a cervical check near your due date to see if your cervix has softened or thinned (effaced).  You will be tested for Group B streptococcus. This happens between 35 and 37 weeks. Your health care provider may ask you:  What your birth plan is.  How you are feeling.  If you are feeling the baby move.  If you have had any abnormal   symptoms, such as leaking fluid, bleeding, severe headaches, or abdominal cramping.  If you are using any tobacco products, including cigarettes, chewing tobacco, and electronic cigarettes.  If you have any questions. Other tests or screenings that may be performed during your third trimester include:  Blood tests that check for low iron levels (anemia).  Fetal testing to check the health, activity level, and growth of the fetus. Testing is done if you have certain medical conditions or if there are problems during the pregnancy.  Nonstress test  (NST). This test checks the health of your baby to make sure there are no signs of problems, such as the baby not getting enough oxygen. During this test, a belt is placed around your belly. The baby is made to move, and its heart rate is monitored during movement. What is false labor? False labor is a condition in which you feel small, irregular tightenings of the muscles in the womb (contractions) that usually go away with rest, changing position, or drinking water. These are called Braxton Hicks contractions. Contractions may last for hours, days, or even weeks before true labor sets in. If contractions come at regular intervals, become more frequent, increase in intensity, or become painful, you should see your health care provider. What are the signs of labor?  Abdominal cramps.  Regular contractions that start at 10 minutes apart and become stronger and more frequent with time.  Contractions that start on the top of the uterus and spread down to the lower abdomen and back.  Increased pelvic pressure and dull back pain.  A watery or bloody mucus discharge that comes from the vagina.  Leaking of amniotic fluid. This is also known as your "water breaking." It could be a slow trickle or a gush. Let your health care provider know if it has a color or strange odor. If you have any of these signs, call your health care provider right away, even if it is before your due date. Follow these instructions at home: Medicines  Follow your health care provider's instructions regarding medicine use. Specific medicines may be either safe or unsafe to take during pregnancy.  Take a prenatal vitamin that contains at least 600 micrograms (mcg) of folic acid.  If you develop constipation, try taking a stool softener if your health care provider approves. Eating and drinking   Eat a balanced diet that includes fresh fruits and vegetables, whole grains, good sources of protein such as meat, eggs, or tofu,  and low-fat dairy. Your health care provider will help you determine the amount of weight gain that is right for you.  Avoid raw meat and uncooked cheese. These carry germs that can cause birth defects in the baby.  If you have low calcium intake from food, talk to your health care provider about whether you should take a daily calcium supplement.  Eat four or five small meals rather than three large meals a day.  Limit foods that are high in fat and processed sugars, such as fried and sweet foods.  To prevent constipation: ? Drink enough fluid to keep your urine clear or pale yellow. ? Eat foods that are high in fiber, such as fresh fruits and vegetables, whole grains, and beans. Activity  Exercise only as directed by your health care provider. Most women can continue their usual exercise routine during pregnancy. Try to exercise for 30 minutes at least 5 days a week. Stop exercising if you experience uterine contractions.  Avoid heavy lifting.  Do   not exercise in extreme heat or humidity, or at high altitudes.  Wear low-heel, comfortable shoes.  Practice good posture.  You may continue to have sex unless your health care provider tells you otherwise. Relieving pain and discomfort  Take frequent breaks and rest with your legs elevated if you have leg cramps or low back pain.  Take warm sitz baths to soothe any pain or discomfort caused by hemorrhoids. Use hemorrhoid cream if your health care provider approves.  Wear a good support bra to prevent discomfort from breast tenderness.  If you develop varicose veins: ? Wear support pantyhose or compression stockings as told by your healthcare provider. ? Elevate your feet for 15 minutes, 3-4 times a day. Prenatal care  Write down your questions. Take them to your prenatal visits.  Keep all your prenatal visits as told by your health care provider. This is important. Safety  Wear your seat belt at all times when driving.  Make  a list of emergency phone numbers, including numbers for family, friends, the hospital, and police and fire departments. General instructions  Avoid cat litter boxes and soil used by cats. These carry germs that can cause birth defects in the baby. If you have a cat, ask someone to clean the litter box for you.  Do not travel far distances unless it is absolutely necessary and only with the approval of your health care provider.  Do not use hot tubs, steam rooms, or saunas.  Do not drink alcohol.  Do not use any products that contain nicotine or tobacco, such as cigarettes and e-cigarettes. If you need help quitting, ask your health care provider.  Do not use any medicinal herbs or unprescribed drugs. These chemicals affect the formation and growth of the baby.  Do not douche or use tampons or scented sanitary pads.  Do not cross your legs for long periods of time.  To prepare for the arrival of your baby: ? Take prenatal classes to understand, practice, and ask questions about labor and delivery. ? Make a trial run to the hospital. ? Visit the hospital and tour the maternity area. ? Arrange for maternity or paternity leave through employers. ? Arrange for family and friends to take care of pets while you are in the hospital. ? Purchase a rear-facing car seat and make sure you know how to install it in your car. ? Pack your hospital bag. ? Prepare the baby's nursery. Make sure to remove all pillows and stuffed animals from the baby's crib to prevent suffocation.  Visit your dentist if you have not gone during your pregnancy. Use a soft toothbrush to brush your teeth and be gentle when you floss. Contact a health care provider if:  You are unsure if you are in labor or if your water has broken.  You become dizzy.  You have mild pelvic cramps, pelvic pressure, or nagging pain in your abdominal area.  You have lower back pain.  You have persistent nausea, vomiting, or  diarrhea.  You have an unusual or bad smelling vaginal discharge.  You have pain when you urinate. Get help right away if:  Your water breaks before 37 weeks.  You have regular contractions less than 5 minutes apart before 37 weeks.  You have a fever.  You are leaking fluid from your vagina.  You have spotting or bleeding from your vagina.  You have severe abdominal pain or cramping.  You have rapid weight loss or weight gain.  You have   shortness of breath with chest pain.  You notice sudden or extreme swelling of your face, hands, ankles, feet, or legs.  Your baby makes fewer than 10 movements in 2 hours.  You have severe headaches that do not go away when you take medicine.  You have vision changes. Summary  The third trimester is from week 28 through week 40, months 7 through 9. The third trimester is a time when the unborn baby (fetus) is growing rapidly.  During the third trimester, your discomfort may increase as you and your baby continue to gain weight. You may have abdominal, leg, and back pain, sleeping problems, and an increased need to urinate.  During the third trimester your breasts will keep growing and they will continue to become tender. A yellow fluid (colostrum) may leak from your breasts. This is the first milk you are producing for your baby.  False labor is a condition in which you feel small, irregular tightenings of the muscles in the womb (contractions) that eventually go away. These are called Braxton Hicks contractions. Contractions may last for hours, days, or even weeks before true labor sets in.  Signs of labor can include: abdominal cramps; regular contractions that start at 10 minutes apart and become stronger and more frequent with time; watery or bloody mucus discharge that comes from the vagina; increased pelvic pressure and dull back pain; and leaking of amniotic fluid. This information is not intended to replace advice given to you by your  health care provider. Make sure you discuss any questions you have with your health care provider. Document Revised: 03/22/2019 Document Reviewed: 01/04/2017 Elsevier Patient Education  2020 [employer]  

## 2020-09-13 LAB — CBC
Hematocrit: 37.2 % (ref 34.0–46.6)
Hemoglobin: 12.7 g/dL (ref 11.1–15.9)
MCH: 32.9 pg (ref 26.6–33.0)
MCHC: 34.1 g/dL (ref 31.5–35.7)
MCV: 96 fL (ref 79–97)
Platelets: 259 10*3/uL (ref 150–450)
RBC: 3.86 x10E6/uL (ref 3.77–5.28)
RDW: 12 % (ref 11.7–15.4)
WBC: 11.3 10*3/uL — ABNORMAL HIGH (ref 3.4–10.8)

## 2020-09-13 LAB — GLUCOSE TOLERANCE, 2 HOURS W/ 1HR: Glucose, Fasting: 93 mg/dL — ABNORMAL HIGH (ref 65–91)

## 2020-09-13 LAB — HIV ANTIBODY (ROUTINE TESTING W REFLEX): HIV Screen 4th Generation wRfx: NONREACTIVE

## 2020-09-13 LAB — RPR: RPR Ser Ql: NONREACTIVE

## 2020-09-17 ENCOUNTER — Other Ambulatory Visit: Payer: Self-pay | Admitting: General Practice

## 2020-09-17 ENCOUNTER — Other Ambulatory Visit: Payer: Self-pay

## 2020-09-17 DIAGNOSIS — O0990 Supervision of high risk pregnancy, unspecified, unspecified trimester: Secondary | ICD-10-CM

## 2020-09-18 LAB — GLUCOSE TOLERANCE, 2 HOURS W/ 1HR
Glucose, 1 hour: 176 mg/dL (ref 65–179)
Glucose, 2 hour: 114 mg/dL (ref 65–152)
Glucose, Fasting: 88 mg/dL (ref 65–91)

## 2020-09-18 LAB — CBC
Hematocrit: 39.1 % (ref 34.0–46.6)
Hemoglobin: 13.1 g/dL (ref 11.1–15.9)
MCH: 32.6 pg (ref 26.6–33.0)
MCHC: 33.5 g/dL (ref 31.5–35.7)
MCV: 97 fL (ref 79–97)
Platelets: 261 10*3/uL (ref 150–450)
RBC: 4.02 x10E6/uL (ref 3.77–5.28)
RDW: 12.2 % (ref 11.7–15.4)
WBC: 12.7 10*3/uL — ABNORMAL HIGH (ref 3.4–10.8)

## 2020-09-18 LAB — RPR: RPR Ser Ql: NONREACTIVE

## 2020-09-18 LAB — HIV ANTIBODY (ROUTINE TESTING W REFLEX): HIV Screen 4th Generation wRfx: NONREACTIVE

## 2020-09-23 ENCOUNTER — Telehealth: Payer: Self-pay

## 2020-09-23 NOTE — Telephone Encounter (Signed)
Called Pt using [address] [place] ID# [phone], gave Glucose test results, Pt verbalized understanding.

## 2020-09-23 NOTE — Telephone Encounter (Signed)
-----   Message from Hermina Staggers, MD sent at 09/19/2020  7:50 PM EDT ----- Please let Ms [name] know that she passed her Glucola.  Thanks Casimiro Needle

## 2020-09-26 ENCOUNTER — Ambulatory Visit (INDEPENDENT_AMBULATORY_CARE_PROVIDER_SITE_OTHER): Payer: Self-pay | Admitting: Obstetrics and Gynecology

## 2020-09-26 ENCOUNTER — Ambulatory Visit: Payer: Self-pay | Attending: Maternal & Fetal Medicine

## 2020-09-26 ENCOUNTER — Encounter: Payer: Self-pay | Admitting: *Deleted

## 2020-09-26 ENCOUNTER — Other Ambulatory Visit: Payer: Self-pay

## 2020-09-26 ENCOUNTER — Ambulatory Visit: Payer: Self-pay | Admitting: *Deleted

## 2020-09-26 ENCOUNTER — Other Ambulatory Visit: Payer: Self-pay | Admitting: *Deleted

## 2020-09-26 VITALS — BP 122/81 | HR 79 | Wt 170.2 lb

## 2020-09-26 DIAGNOSIS — I10 Essential (primary) hypertension: Secondary | ICD-10-CM

## 2020-09-26 DIAGNOSIS — O09213 Supervision of pregnancy with history of pre-term labor, third trimester: Secondary | ICD-10-CM

## 2020-09-26 DIAGNOSIS — O10919 Unspecified pre-existing hypertension complicating pregnancy, unspecified trimester: Secondary | ICD-10-CM

## 2020-09-26 DIAGNOSIS — R7401 Elevation of levels of liver transaminase levels: Secondary | ICD-10-CM

## 2020-09-26 DIAGNOSIS — O09523 Supervision of elderly multigravida, third trimester: Secondary | ICD-10-CM

## 2020-09-26 DIAGNOSIS — O0990 Supervision of high risk pregnancy, unspecified, unspecified trimester: Secondary | ICD-10-CM

## 2020-09-26 DIAGNOSIS — O3443 Maternal care for other abnormalities of cervix, third trimester: Secondary | ICD-10-CM

## 2020-09-26 DIAGNOSIS — Z23 Encounter for immunization: Secondary | ICD-10-CM

## 2020-09-26 DIAGNOSIS — Z658 Other specified problems related to psychosocial circumstances: Secondary | ICD-10-CM | POA: Insufficient documentation

## 2020-09-26 DIAGNOSIS — Z789 Other specified health status: Secondary | ICD-10-CM

## 2020-09-26 DIAGNOSIS — Z3A29 29 weeks gestation of pregnancy: Secondary | ICD-10-CM

## 2020-09-26 DIAGNOSIS — O10913 Unspecified pre-existing hypertension complicating pregnancy, third trimester: Secondary | ICD-10-CM

## 2020-09-26 DIAGNOSIS — Z362 Encounter for other antenatal screening follow-up: Secondary | ICD-10-CM

## 2020-09-26 LAB — POCT URINALYSIS DIP (DEVICE)
Bilirubin Urine: NEGATIVE
Glucose, UA: NEGATIVE mg/dL
Hgb urine dipstick: NEGATIVE
Ketones, ur: NEGATIVE mg/dL
Leukocytes,Ua: NEGATIVE
Nitrite: NEGATIVE
Protein, ur: NEGATIVE mg/dL
Specific Gravity, Urine: >=1.03 (ref 1.005–1.030)
Urobilinogen, UA: 0.2 mg/dL (ref 0.0–1.0)
pH: 5.5 (ref 5.0–8.0)
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
## 2020-09-26 NOTE — Addendum Note (Signed)
Addended by: Maxwell Marion E on: 09/26/2020 12:29 PM   Modules accepted: Orders

## 2020-09-27 LAB — COMPREHENSIVE METABOLIC PANEL
ALT: 16 IU/L (ref 0–32)
AST: 22 IU/L (ref 0–40)
Albumin/Globulin Ratio: 1.6 (ref 1.2–2.2)
Albumin: 3.4 g/dL — ABNORMAL LOW (ref 3.8–4.8)
Alkaline Phosphatase: 145 IU/L — ABNORMAL HIGH (ref 44–121)
BUN/Creatinine Ratio: 38 — ABNORMAL HIGH (ref 9–23)
BUN: 18 mg/dL (ref 6–20)
Bilirubin Total: <0.2 mg/dL (ref 0.0–1.2)
CO2: 18 mmol/L — ABNORMAL LOW (ref 20–29)
Calcium: 9 mg/dL (ref 8.7–10.2)
Chloride: 102 mmol/L (ref 96–106)
Creatinine, Ser: 0.47 mg/dL — ABNORMAL LOW (ref 0.57–1.00)
GFR calc Af Amer: 146 mL/min/{1.73_m2} (ref >59)
GFR calc non Af Amer: 127 mL/min/{1.73_m2} (ref >59)
Globulin, Total: 2.1 g/dL (ref 1.5–4.5)
Glucose: 86 mg/dL (ref 65–99)
Potassium: 4.5 mmol/L (ref 3.5–5.2)
Sodium: 135 mmol/L (ref 134–144)
Total Protein: 5.5 g/dL — ABNORMAL LOW (ref 6.0–8.5)

## 2020-10-17 ENCOUNTER — Encounter: Payer: Self-pay | Admitting: *Deleted

## 2020-10-17 ENCOUNTER — Encounter: Payer: Self-pay | Admitting: Obstetrics and Gynecology

## 2020-10-17 ENCOUNTER — Ambulatory Visit: Payer: Self-pay | Admitting: *Deleted

## 2020-10-17 ENCOUNTER — Other Ambulatory Visit: Payer: Self-pay

## 2020-10-17 ENCOUNTER — Ambulatory Visit: Payer: Self-pay | Attending: Obstetrics and Gynecology

## 2020-10-17 DIAGNOSIS — Z658 Other specified problems related to psychosocial circumstances: Secondary | ICD-10-CM

## 2020-10-17 DIAGNOSIS — O0990 Supervision of high risk pregnancy, unspecified, unspecified trimester: Secondary | ICD-10-CM

## 2020-10-17 DIAGNOSIS — O10919 Unspecified pre-existing hypertension complicating pregnancy, unspecified trimester: Secondary | ICD-10-CM | POA: Insufficient documentation

## 2020-10-20 ENCOUNTER — Ambulatory Visit (INDEPENDENT_AMBULATORY_CARE_PROVIDER_SITE_OTHER): Payer: Self-pay | Admitting: Obstetrics & Gynecology

## 2020-10-20 ENCOUNTER — Other Ambulatory Visit: Payer: Self-pay

## 2020-10-20 VITALS — BP 146/92 | HR 84 | Wt 170.0 lb

## 2020-10-20 DIAGNOSIS — O0990 Supervision of high risk pregnancy, unspecified, unspecified trimester: Secondary | ICD-10-CM

## 2020-10-20 DIAGNOSIS — I10 Essential (primary) hypertension: Secondary | ICD-10-CM

## 2020-10-20 DIAGNOSIS — Z789 Other specified health status: Secondary | ICD-10-CM

## 2020-10-20 LAB — POCT URINALYSIS DIP (DEVICE)
Bilirubin Urine: NEGATIVE
Glucose, UA: NEGATIVE mg/dL
Hgb urine dipstick: NEGATIVE
Ketones, ur: NEGATIVE mg/dL
Leukocytes,Ua: NEGATIVE
Nitrite: NEGATIVE
Protein, ur: 30 mg/dL — AB
Specific Gravity, Urine: >=1.03 (ref 1.005–1.030)
Urobilinogen, UA: 0.2 mg/dL (ref 0.0–1.0)
pH: 6 (ref 5.0–8.0)

## 2020-10-20 MED ORDER — LABETALOL HCL 200 MG PO TABS: 400.0000 mg | ORAL_TABLET | Freq: Two times a day (BID) | ORAL | 3 refills | Status: DC
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
## 2020-10-20 NOTE — Patient Instructions (Signed)
Hipertensión durante el embarazo °Hypertension During Pregnancy °La hipertensión también se denomina presión arterial alta. La presión arterial alta significa que la sangre se mueve por el cuerpo con mucha fuerza. Puede causar problemas para usted y su bebé. Durante el embarazo pueden producirse diferentes tipos de presión arterial alta. Los tipos son los siguientes: °· Presión arterial alta antes de quedar embarazada. Esto se denomina hipertensión crónica..  Puede continuar durante el embarazo. El médico querrá continuar controlándole la presión arterial. Es posible que necesite medicamentos para mantener la presión arterial bajo control mientras está embarazada. Deberá concurrir a visitas de seguimiento después de haber tenido el bebé. °· Presión arterial alta que sube durante el embarazo cuando antes era normal. Esto se denomina hipertensión gestacional. Por lo general, mejorará después de que tenga el bebé, pero el médico deberá controlarle la presión arterial para asegurarse de que esté mejorando. °· Presión arterial muy alta durante el embarazo. Esto se denomina preeclampsia. La presión arterial muy alta es una emergencia que debe controlarse y tratarse de inmediato. °· Puede desarrollar presión arterial muy alta después del parto. Esto se denomina preeclampsia posparto. Esto normalmente ocurre dentro de las 48 horas después del nacimiento del bebé, pero puede aparecer hasta 6 semanas después de dar a luz. Esto es poco frecuente. °¿De qué modo me afecta? °Si tiene presión arterial alta durante el embarazo, tiene más probabilidades de desarrollar presión arterial alta: °· A medida que envejece. °· Si queda embarazada nuevamente. °En algunos casos, la presión arterial alta durante el embarazo puede causar lo siguiente: °· Accidente cerebrovascular. °· Infarto de miocardio. °· Daño en los riñones, los pulmones o el hígado. °· Preeclampsia. °· Movimientos espasmódicos que no puede controlar  (convulsiones). °· Problemas con la placenta. °¿Cómo afecta esto al bebé? °El bebé puede: °· Nacer antes de tiempo. °· No pesar tanto como debería. °· No controlar bien el trabajo de parto, lo que conduce a un parto por cesárea. °¿Cuáles son los riesgos? °· Haber tenido presión arterial alta durante un embarazo anterior. °· Tener sobrepeso. °· Tener 35 años o más. °· Estar embarazada por primera vez. °· Estar embarazada de más de un bebé. °· Embarazarse a través de métodos de fertilización como fertilización in vitro (FIV). °· Tener otros problemas, como diabetes o enfermedad renal. °· Tener familiares con presión arterial alta. °¿Qué puedo hacer para disminuir el riesgo? ° °· Mantenga un peso saludable. °· Siga una dieta saludable. °· Siga las indicaciones del médico acerca de tratar cualquier problema médico que tenga antes de quedar embarazada. °Es muy importante concurrir a todas las visitas al médico. El médico verificará su presión arterial y se asegurará de que el embarazo progrese como corresponde. El tratamiento debe comenzar con prontitud si se detecta un problema. °¿Cómo se trata? °El tratamiento de la presión arterial alta durante el embarazo puede variar según el tipo de presión arterial alta que tenga y su gravedad. °· Es posible que tenga que tomar medicamentos para la presión arterial. °· Si ha estado tomando un medicamento para la presión arterial, es posible que deba cambiar el medicamento durante el embarazo si no es seguro para el bebé. °· Si el médico cree que usted podría tener presión arterial muy alta, puede indicarle que tome una dosis baja de aspirina durante el embarazo. °· Si tiene presión arterial muy alta, es posible que deba permanecer en el hospital para que usted y el bebé puedan ser vigilados de cerca. Es posible que deba tomar medicamentos para disminuir la   presión arterial. Este medicamento se puede administrar por boca o a través de un tubo (catéter) intravenoso. °· En ciertos  casos, si su afección empeora, es posible que necesite dar a luz prematuramente. °Siga estas instrucciones en su casa: °Comida y bebida ° °· Beba suficiente líquido para mantener el pis (laorina) de color amarillo pálido. °· Evite la cafeína. °Estilo de vida °· No consuma ningún producto que contenga nicotina o tabaco, como cigarrillos, cigarrillos electrónicos y tabaco de mascar. Si necesita ayuda para dejar de fumar, consulte al médico. °· No consuma drogas ni alcohol. °· Evite el estrés. °· Haga reposo y duerma bien. °· La actividad física regular puede ayudar. Consulte al médico qué tipos de ejercicios son mejores para usted. °Indicaciones generales °· Use los medicamentos de venta libre y los recetados solamente como se lo haya indicado el médico. °· Concurra a todas las visitas de atención prenatal y de seguimiento como se lo haya indicado el médico. Esto es importante. °Comuníquese con un médico si: °· Tiene síntomas a los que su médico le indicó que debería estar atenta, tales como: °? Dolores de cabeza. °? Náuseas. °? Vómitos. °? Dolor en el vientre (abdominal). °? Mareos. °? Desvanecimiento. °Solicite ayuda inmediatamente si: °· Tiene lo siguiente: °? Dolor muy intenso en el vientre que no mejora con el tratamiento. °? Dolor de cabeza muy intenso que no mejora. °? Vómitos que no mejoran. °? Aumenta de peso de forma rápida y repentina. °? Hinchazón repentina en las manos, los tobillos o el rostro. °? Hemorragia vaginal. °? Sangre en la orina. °? Visión borrosa. °? Visión doble. °? Falta de aire. °? Dolor en el pecho. °? Debilidad en un lado del cuerpo. °? Dificultad para hablar. °· El bebé no se mueve tanto como sería usual. °Resumen °· La presión arterial alta también se denomina hipertensión. °· La presión arterial alta significa que la sangre se mueve por el cuerpo con mucha fuerza. °· La presión arterial alta puede causarles problemas a usted y al bebé. °· Concurra a todas las visitas de seguimiento  como se lo haya indicado el médico. Esto es importante. °Esta información no tiene como fin reemplazar el consejo del médico. Asegúrese de hacerle al médico cualquier pregunta que tenga. °Document Revised: 01/26/2019 Document Reviewed: 01/26/2019 °Elsevier Patient Education © 2020 [employer] Inc[employer] ° °

## 2020-10-24 ENCOUNTER — Encounter: Payer: Self-pay | Admitting: *Deleted

## 2020-10-24 ENCOUNTER — Ambulatory Visit: Payer: Self-pay | Admitting: *Deleted

## 2020-10-24 ENCOUNTER — Other Ambulatory Visit: Payer: Self-pay | Admitting: Obstetrics

## 2020-10-24 ENCOUNTER — Other Ambulatory Visit: Payer: Self-pay

## 2020-10-24 ENCOUNTER — Ambulatory Visit: Payer: Self-pay | Attending: Obstetrics and Gynecology

## 2020-10-24 DIAGNOSIS — Z658 Other specified problems related to psychosocial circumstances: Secondary | ICD-10-CM | POA: Insufficient documentation

## 2020-10-24 DIAGNOSIS — Z362 Encounter for other antenatal screening follow-up: Secondary | ICD-10-CM

## 2020-10-24 DIAGNOSIS — O09523 Supervision of elderly multigravida, third trimester: Secondary | ICD-10-CM

## 2020-10-24 DIAGNOSIS — O0990 Supervision of high risk pregnancy, unspecified, unspecified trimester: Secondary | ICD-10-CM

## 2020-10-24 DIAGNOSIS — O10913 Unspecified pre-existing hypertension complicating pregnancy, third trimester: Secondary | ICD-10-CM

## 2020-10-24 DIAGNOSIS — O10919 Unspecified pre-existing hypertension complicating pregnancy, unspecified trimester: Secondary | ICD-10-CM | POA: Insufficient documentation

## 2020-10-24 DIAGNOSIS — Z3A33 33 weeks gestation of pregnancy: Secondary | ICD-10-CM

## 2020-10-24 DIAGNOSIS — O3443 Maternal care for other abnormalities of cervix, third trimester: Secondary | ICD-10-CM

## 2020-10-24 DIAGNOSIS — O09213 Supervision of pregnancy with history of pre-term labor, third trimester: Secondary | ICD-10-CM

## 2020-10-24 NOTE — Procedures (Signed)
[name] [place] 12/13/1981 [redacted]w[redacted]d  Fetus A Non-Stress Test Interpretation for 10/24/20  Indication: Chronic Hypertenstion, AMA  Fetal Heart Rate A Mode: External Baseline Rate (A): 130 bpm Variability: Moderate Accelerations: 15 x 15 Decelerations: None Multiple birth?: No  Uterine Activity Mode: Palpation, Toco Contraction Frequency (min): None Resting Tone Palpated: Relaxed Resting Time: Adequate  Interpretation (Fetal Testing) Nonstress Test Interpretation: Reactive Comments: Dr. Judeth Cornfield reviewed tracing.

## 2020-10-27 ENCOUNTER — Other Ambulatory Visit: Payer: Self-pay | Admitting: *Deleted

## 2020-10-27 DIAGNOSIS — O10919 Unspecified pre-existing hypertension complicating pregnancy, unspecified trimester: Secondary | ICD-10-CM

## 2020-10-30 ENCOUNTER — Ambulatory Visit (INDEPENDENT_AMBULATORY_CARE_PROVIDER_SITE_OTHER): Payer: Self-pay | Admitting: *Deleted

## 2020-10-30 ENCOUNTER — Ambulatory Visit (INDEPENDENT_AMBULATORY_CARE_PROVIDER_SITE_OTHER): Payer: Self-pay | Admitting: Obstetrics and Gynecology

## 2020-10-30 ENCOUNTER — Ambulatory Visit (INDEPENDENT_AMBULATORY_CARE_PROVIDER_SITE_OTHER): Payer: Self-pay

## 2020-10-30 ENCOUNTER — Other Ambulatory Visit: Payer: Self-pay

## 2020-10-30 ENCOUNTER — Encounter: Payer: Self-pay | Admitting: Obstetrics and Gynecology

## 2020-10-30 VITALS — BP 152/88 | HR 82 | Wt 175.8 lb

## 2020-10-30 DIAGNOSIS — O09523 Supervision of elderly multigravida, third trimester: Secondary | ICD-10-CM

## 2020-10-30 DIAGNOSIS — O09899 Supervision of other high risk pregnancies, unspecified trimester: Secondary | ICD-10-CM

## 2020-10-30 DIAGNOSIS — I10 Essential (primary) hypertension: Secondary | ICD-10-CM

## 2020-10-30 DIAGNOSIS — Z789 Other specified health status: Secondary | ICD-10-CM

## 2020-10-30 DIAGNOSIS — O0990 Supervision of high risk pregnancy, unspecified, unspecified trimester: Secondary | ICD-10-CM

## 2020-10-30 MED ORDER — LABETALOL HCL 200 MG PO TABS: 400.0000 mg | ORAL_TABLET | Freq: Three times a day (TID) | ORAL | 3 refills | Status: DC

## 2020-10-30 NOTE — Progress Notes (Signed)
[employer] video interpreter [name] [phone] usedfor encounter. Pt denies H/A or visual disturbances. She has weekly NST/BPP scheduled in office.  [place] @ MFM for f/u growth and BPP scheduled on 12/10

## 2020-10-30 NOTE — Progress Notes (Signed)
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
## 2020-11-05 ENCOUNTER — Ambulatory Visit (INDEPENDENT_AMBULATORY_CARE_PROVIDER_SITE_OTHER): Payer: Self-pay | Admitting: Family Medicine

## 2020-11-05 ENCOUNTER — Other Ambulatory Visit: Payer: Self-pay

## 2020-11-05 ENCOUNTER — Encounter: Payer: Self-pay | Admitting: Family Medicine

## 2020-11-05 ENCOUNTER — Ambulatory Visit (INDEPENDENT_AMBULATORY_CARE_PROVIDER_SITE_OTHER): Payer: Self-pay | Admitting: *Deleted

## 2020-11-05 ENCOUNTER — Ambulatory Visit: Payer: Self-pay

## 2020-11-05 VITALS — BP 167/92 | HR 71 | Wt 177.2 lb

## 2020-11-05 DIAGNOSIS — O169 Unspecified maternal hypertension, unspecified trimester: Secondary | ICD-10-CM

## 2020-11-05 DIAGNOSIS — O0990 Supervision of high risk pregnancy, unspecified, unspecified trimester: Secondary | ICD-10-CM

## 2020-11-05 DIAGNOSIS — O09523 Supervision of elderly multigravida, third trimester: Secondary | ICD-10-CM

## 2020-11-05 DIAGNOSIS — I10 Essential (primary) hypertension: Secondary | ICD-10-CM

## 2020-11-05 DIAGNOSIS — O09899 Supervision of other high risk pregnancies, unspecified trimester: Secondary | ICD-10-CM

## 2020-11-05 LAB — COMPREHENSIVE METABOLIC PANEL
ALT: 16 IU/L (ref 0–32)
AST: 19 IU/L (ref 0–40)
Albumin/Globulin Ratio: 1.4 (ref 1.2–2.2)
Albumin: 3.4 g/dL — ABNORMAL LOW (ref 3.8–4.8)
Alkaline Phosphatase: 218 IU/L — ABNORMAL HIGH (ref 44–121)
BUN/Creatinine Ratio: 24 — ABNORMAL HIGH (ref 9–23)
BUN: 14 mg/dL (ref 6–20)
Bilirubin Total: 0.3 mg/dL (ref 0.0–1.2)
CO2: 19 mmol/L — ABNORMAL LOW (ref 20–29)
Calcium: 9 mg/dL (ref 8.7–10.2)
Chloride: 104 mmol/L (ref 96–106)
Creatinine, Ser: 0.58 mg/dL (ref 0.57–1.00)
GFR calc Af Amer: 136 mL/min/{1.73_m2} (ref >59)
GFR calc non Af Amer: 118 mL/min/{1.73_m2} (ref >59)
Globulin, Total: 2.5 g/dL (ref 1.5–4.5)
Glucose: 71 mg/dL (ref 65–99)
Potassium: 4.5 mmol/L (ref 3.5–5.2)
Sodium: 136 mmol/L (ref 134–144)
Total Protein: 5.9 g/dL — ABNORMAL LOW (ref 6.0–8.5)

## 2020-11-05 LAB — CBC
Hematocrit: 38.1 % (ref 34.0–46.6)
Hemoglobin: 13.3 g/dL (ref 11.1–15.9)
MCH: 33.6 pg — ABNORMAL HIGH (ref 26.6–33.0)
MCHC: 34.9 g/dL (ref 31.5–35.7)
MCV: 96 fL (ref 79–97)
Platelets: 212 10*3/uL (ref 150–450)
RBC: 3.96 x10E6/uL (ref 3.77–5.28)
RDW: 12.4 % (ref 11.7–15.4)
WBC: 9.5 10*3/uL (ref 3.4–10.8)

## 2020-11-05 LAB — PROTEIN / CREATININE RATIO, URINE
Creatinine, Urine: 156.8 mg/dL
Protein, Ur: 41.9 mg/dL
Protein/Creat Ratio: 267 mg/g creat — ABNORMAL HIGH (ref 0–200)

## 2020-11-05 MED ORDER — NIFEDIPINE ER OSMOTIC RELEASE 30 MG PO TB24: 30.0000 mg | ORAL_TABLET | Freq: Every day | ORAL | 2 refills | Status: DC

## 2020-11-05 NOTE — Progress Notes (Signed)
Interpreter Eda Royal present for encounter. Pt denies H/A or visual disturbances. She states that on some days she falls asleep in the evening and does not take the third dose of Labetalol.  [place] for growth scheduled on 12/10.
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
## 2020-11-05 NOTE — Progress Notes (Signed)

## 2020-11-12 NOTE — Progress Notes (Signed)
Patient was assessed and managed by nursing staff during this encounter. I have reviewed the chart and agree with the documentation and plan. I have also made any necessary editorial changes.  Zeev Deakins, MD 11/12/2020 8:30 AM   

## 2020-11-13 ENCOUNTER — Other Ambulatory Visit: Payer: Self-pay

## 2020-11-13 ENCOUNTER — Ambulatory Visit: Payer: Self-pay

## 2020-11-13 ENCOUNTER — Ambulatory Visit (INDEPENDENT_AMBULATORY_CARE_PROVIDER_SITE_OTHER): Payer: Self-pay | Admitting: *Deleted

## 2020-11-13 ENCOUNTER — Other Ambulatory Visit (HOSPITAL_COMMUNITY)
Admission: RE | Admit: 2020-11-13 | Discharge: 2020-11-13 | Disposition: A | Discharge status: Home / Self Care | Payer: Self-pay | Source: Ambulatory Visit | Attending: Obstetrics & Gynecology | Admitting: Obstetrics & Gynecology

## 2020-11-13 ENCOUNTER — Ambulatory Visit (INDEPENDENT_AMBULATORY_CARE_PROVIDER_SITE_OTHER): Payer: Self-pay | Admitting: Obstetrics & Gynecology

## 2020-11-13 VITALS — BP 141/85 | HR 71 | Wt 181.6 lb

## 2020-11-13 DIAGNOSIS — O0993 Supervision of high risk pregnancy, unspecified, third trimester: Secondary | ICD-10-CM | POA: Insufficient documentation

## 2020-11-13 DIAGNOSIS — O169 Unspecified maternal hypertension, unspecified trimester: Secondary | ICD-10-CM

## 2020-11-13 DIAGNOSIS — Z3A36 36 weeks gestation of pregnancy: Secondary | ICD-10-CM

## 2020-11-13 DIAGNOSIS — O0990 Supervision of high risk pregnancy, unspecified, unspecified trimester: Secondary | ICD-10-CM

## 2020-11-13 DIAGNOSIS — O163 Unspecified maternal hypertension, third trimester: Secondary | ICD-10-CM | POA: Insufficient documentation

## 2020-11-13 LAB — POCT URINALYSIS DIP (DEVICE)
Bilirubin Urine: NEGATIVE
Glucose, UA: NEGATIVE mg/dL
Hgb urine dipstick: NEGATIVE
Ketones, ur: NEGATIVE mg/dL
Leukocytes,Ua: NEGATIVE
Nitrite: NEGATIVE
Protein, ur: NEGATIVE mg/dL
Specific Gravity, Urine: 1.025 (ref 1.005–1.030)
Urobilinogen, UA: 0.2 mg/dL (ref 0.0–1.0)
pH: 6 (ref 5.0–8.0)
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
## 2020-11-13 NOTE — Progress Notes (Signed)
Interpreter Eda Royal present for encounter. Pt reports feeling pressure in RUQ - denies pain. [place] for growth & BPP @ MFM on 12/10.

## 2020-11-14 LAB — CBC
Hematocrit: 36.9 % (ref 34.0–46.6)
Hemoglobin: 12.9 g/dL (ref 11.1–15.9)
MCH: 33.2 pg — ABNORMAL HIGH (ref 26.6–33.0)
MCHC: 35 g/dL (ref 31.5–35.7)
MCV: 95 fL (ref 79–97)
Platelets: 199 10*3/uL (ref 150–450)
RBC: 3.88 x10E6/uL (ref 3.77–5.28)
RDW: 12.6 % (ref 11.7–15.4)
WBC: 8.8 10*3/uL (ref 3.4–10.8)

## 2020-11-14 LAB — PROTEIN / CREATININE RATIO, URINE
Creatinine, Urine: 147.1 mg/dL
Protein, Ur: 24.5 mg/dL
Protein/Creat Ratio: 167 mg/g creat (ref 0–200)

## 2020-11-14 LAB — GC/CHLAMYDIA PROBE AMP (~~LOC~~) NOT AT ARMC
Chlamydia: NEGATIVE
Comment: NEGATIVE
Comment: NORMAL
Neisseria Gonorrhea: NEGATIVE

## 2020-11-14 LAB — COMPREHENSIVE METABOLIC PANEL
ALT: 18 IU/L (ref 0–32)
AST: 22 IU/L (ref 0–40)
Albumin/Globulin Ratio: 1.6 (ref 1.2–2.2)
Albumin: 3.4 g/dL — ABNORMAL LOW (ref 3.8–4.8)
Alkaline Phosphatase: 224 IU/L — ABNORMAL HIGH (ref 44–121)
BUN/Creatinine Ratio: 29 — ABNORMAL HIGH (ref 9–23)
BUN: 18 mg/dL (ref 6–20)
Bilirubin Total: 0.3 mg/dL (ref 0.0–1.2)
CO2: 20 mmol/L (ref 20–29)
Calcium: 8.9 mg/dL (ref 8.7–10.2)
Chloride: 102 mmol/L (ref 96–106)
Creatinine, Ser: 0.62 mg/dL (ref 0.57–1.00)
GFR calc Af Amer: 133 mL/min/{1.73_m2} (ref >59)
GFR calc non Af Amer: 116 mL/min/{1.73_m2} (ref >59)
Globulin, Total: 2.1 g/dL (ref 1.5–4.5)
Glucose: 104 mg/dL — ABNORMAL HIGH (ref 65–99)
Potassium: 4.7 mmol/L (ref 3.5–5.2)
Sodium: 135 mmol/L (ref 134–144)
Total Protein: 5.5 g/dL — ABNORMAL LOW (ref 6.0–8.5)

## 2020-11-15 LAB — STREP GP B NAA: Strep Gp B NAA: NEGATIVE

## 2020-11-20 ENCOUNTER — Other Ambulatory Visit: Payer: Self-pay

## 2020-11-20 ENCOUNTER — Ambulatory Visit: Payer: Self-pay

## 2020-11-20 ENCOUNTER — Ambulatory Visit (INDEPENDENT_AMBULATORY_CARE_PROVIDER_SITE_OTHER): Payer: Self-pay | Admitting: Obstetrics & Gynecology

## 2020-11-20 ENCOUNTER — Ambulatory Visit (INDEPENDENT_AMBULATORY_CARE_PROVIDER_SITE_OTHER): Payer: Self-pay | Admitting: *Deleted

## 2020-11-20 VITALS — BP 131/86 | HR 78 | Wt 177.0 lb

## 2020-11-20 DIAGNOSIS — O169 Unspecified maternal hypertension, unspecified trimester: Secondary | ICD-10-CM

## 2020-11-20 DIAGNOSIS — O163 Unspecified maternal hypertension, third trimester: Secondary | ICD-10-CM

## 2020-11-20 DIAGNOSIS — O0993 Supervision of high risk pregnancy, unspecified, third trimester: Secondary | ICD-10-CM

## 2020-11-20 DIAGNOSIS — O09893 Supervision of other high risk pregnancies, third trimester: Secondary | ICD-10-CM

## 2020-11-20 DIAGNOSIS — Z3A37 37 weeks gestation of pregnancy: Secondary | ICD-10-CM

## 2020-11-20 DIAGNOSIS — O0990 Supervision of high risk pregnancy, unspecified, unspecified trimester: Secondary | ICD-10-CM

## 2020-11-20 DIAGNOSIS — Z789 Other specified health status: Secondary | ICD-10-CM

## 2020-11-20 DIAGNOSIS — O09523 Supervision of elderly multigravida, third trimester: Secondary | ICD-10-CM

## 2020-11-20 DIAGNOSIS — O09899 Supervision of other high risk pregnancies, unspecified trimester: Secondary | ICD-10-CM
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
## 2020-11-20 NOTE — Progress Notes (Signed)
Video interpreter Reuel Boom [phone] used for encounter. Pt denies H/A or visual disturbances.  [place] for growth and BPP scheduled tomorrow @ MFM

## 2020-11-21 ENCOUNTER — Other Ambulatory Visit: Payer: Self-pay | Admitting: Obstetrics and Gynecology

## 2020-11-21 ENCOUNTER — Encounter: Payer: Self-pay | Admitting: Obstetrics and Gynecology

## 2020-11-21 ENCOUNTER — Telehealth: Payer: Self-pay | Admitting: Obstetrics and Gynecology

## 2020-11-21 ENCOUNTER — Inpatient Hospital Stay (HOSPITAL_COMMUNITY)
Admission: AD | Admit: 2020-11-21 | Discharge: 2020-11-24 | DRG: 806 | Disposition: A | Discharge status: Home / Self Care | Payer: Medicaid Other | Attending: Obstetrics and Gynecology | Admitting: Obstetrics and Gynecology

## 2020-11-21 ENCOUNTER — Encounter: Payer: Self-pay | Admitting: *Deleted

## 2020-11-21 ENCOUNTER — Ambulatory Visit: Payer: Self-pay | Admitting: *Deleted

## 2020-11-21 ENCOUNTER — Ambulatory Visit: Payer: Self-pay | Attending: Obstetrics and Gynecology

## 2020-11-21 DIAGNOSIS — O1002 Pre-existing essential hypertension complicating childbirth: Secondary | ICD-10-CM | POA: Diagnosis present

## 2020-11-21 DIAGNOSIS — O169 Unspecified maternal hypertension, unspecified trimester: Secondary | ICD-10-CM | POA: Insufficient documentation

## 2020-11-21 DIAGNOSIS — Z3A37 37 weeks gestation of pregnancy: Secondary | ICD-10-CM | POA: Diagnosis not present

## 2020-11-21 DIAGNOSIS — O10919 Unspecified pre-existing hypertension complicating pregnancy, unspecified trimester: Secondary | ICD-10-CM

## 2020-11-21 DIAGNOSIS — Z20822 Contact with and (suspected) exposure to covid-19: Secondary | ICD-10-CM | POA: Diagnosis present

## 2020-11-21 DIAGNOSIS — O0993 Supervision of high risk pregnancy, unspecified, third trimester: Secondary | ICD-10-CM

## 2020-11-21 DIAGNOSIS — Z658 Other specified problems related to psychosocial circumstances: Secondary | ICD-10-CM

## 2020-11-21 DIAGNOSIS — O1410 Severe pre-eclampsia, unspecified trimester: Secondary | ICD-10-CM

## 2020-11-21 DIAGNOSIS — Z23 Encounter for immunization: Secondary | ICD-10-CM

## 2020-11-21 DIAGNOSIS — O115 Pre-existing hypertension with pre-eclampsia, complicating the puerperium: Secondary | ICD-10-CM | POA: Diagnosis not present

## 2020-11-21 DIAGNOSIS — O114 Pre-existing hypertension with pre-eclampsia, complicating childbirth: Secondary | ICD-10-CM | POA: Diagnosis present

## 2020-11-21 DIAGNOSIS — O365930 Maternal care for other known or suspected poor fetal growth, third trimester, not applicable or unspecified: Secondary | ICD-10-CM | POA: Diagnosis present

## 2020-11-21 DIAGNOSIS — O09529 Supervision of elderly multigravida, unspecified trimester: Secondary | ICD-10-CM

## 2020-11-21 DIAGNOSIS — O4103X0 Oligohydramnios, third trimester, not applicable or unspecified: Principal | ICD-10-CM | POA: Diagnosis present

## 2020-11-21 DIAGNOSIS — O0990 Supervision of high risk pregnancy, unspecified, unspecified trimester: Secondary | ICD-10-CM

## 2020-11-21 DIAGNOSIS — I10 Essential (primary) hypertension: Secondary | ICD-10-CM | POA: Diagnosis present

## 2020-11-21 DIAGNOSIS — O4100X0 Oligohydramnios, unspecified trimester, not applicable or unspecified: Secondary | ICD-10-CM | POA: Diagnosis present

## 2020-11-21 DIAGNOSIS — O365990 Maternal care for other known or suspected poor fetal growth, unspecified trimester, not applicable or unspecified: Secondary | ICD-10-CM | POA: Diagnosis present

## 2020-11-21 DIAGNOSIS — Z362 Encounter for other antenatal screening follow-up: Secondary | ICD-10-CM

## 2020-11-21 DIAGNOSIS — O10913 Unspecified pre-existing hypertension complicating pregnancy, third trimester: Secondary | ICD-10-CM

## 2020-11-21 DIAGNOSIS — O1495 Unspecified pre-eclampsia, complicating the puerperium: Secondary | ICD-10-CM | POA: Diagnosis not present

## 2020-11-21 LAB — COMPREHENSIVE METABOLIC PANEL
ALT: 40 U/L (ref 0–44)
AST: 39 U/L (ref 15–41)
Albumin: 2.7 g/dL — ABNORMAL LOW (ref 3.5–5.0)
Alkaline Phosphatase: 231 U/L — ABNORMAL HIGH (ref 38–126)
Anion gap: 12 (ref 5–15)
BUN: 16 mg/dL (ref 6–20)
CO2: 19 mmol/L — ABNORMAL LOW (ref 22–32)
Calcium: 8.8 mg/dL — ABNORMAL LOW (ref 8.9–10.3)
Chloride: 105 mmol/L (ref 98–111)
Creatinine, Ser: 1.03 mg/dL — ABNORMAL HIGH (ref 0.44–1.00)
GFR, Estimated: >60 mL/min (ref >60)
Glucose, Bld: 132 mg/dL — ABNORMAL HIGH (ref 70–99)
Potassium: 4.4 mmol/L (ref 3.5–5.1)
Sodium: 136 mmol/L (ref 135–145)
Total Bilirubin: 0.7 mg/dL (ref 0.3–1.2)
Total Protein: 5.7 g/dL — ABNORMAL LOW (ref 6.5–8.1)

## 2020-11-21 LAB — CBC
HCT: 42 % (ref 36.0–46.0)
Hemoglobin: 13.8 g/dL (ref 12.0–15.0)
MCH: 32.2 pg (ref 26.0–34.0)
MCHC: 32.9 g/dL (ref 30.0–36.0)
MCV: 98.1 fL (ref 80.0–100.0)
Platelets: 228 10*3/uL (ref 150–400)
RBC: 4.28 MIL/uL (ref 3.87–5.11)
RDW: 13.1 % (ref 11.5–15.5)
WBC: 8.3 10*3/uL (ref 4.0–10.5)
nRBC: 0.2 % (ref 0.0–0.2)

## 2020-11-21 LAB — PROTEIN / CREATININE RATIO, URINE
Creatinine, Urine: 109.93 mg/dL
Protein Creatinine Ratio: 0.25 mg/mg{Cre} — ABNORMAL HIGH (ref 0.00–0.15)
Total Protein, Urine: 27 mg/dL

## 2020-11-21 LAB — TYPE AND SCREEN
ABO/RH(D): O POS
Antibody Screen: NEGATIVE

## 2020-11-21 LAB — RESP PANEL BY RT-PCR (FLU A&B, COVID) ARPGX2
Influenza A by PCR: NEGATIVE
Influenza B by PCR: NEGATIVE
SARS Coronavirus 2 by RT PCR: NEGATIVE

## 2020-11-21 MED ORDER — OXYCODONE-ACETAMINOPHEN 5-325 MG PO TABS: 2.0000 | ORAL_TABLET | ORAL | Status: DC | PRN

## 2020-11-21 MED ORDER — NIFEDIPINE ER OSMOTIC RELEASE 30 MG PO TB24
30.0000 mg | ORAL_TABLET | Freq: Every day | ORAL | Status: DC
  Filled 2020-11-21: qty 1

## 2020-11-21 MED ORDER — MAGNESIUM SULFATE 40 GM/1000ML IV SOLN
2.0000 g/h | INTRAVENOUS | Status: DC
  Administered 2020-11-22 (×2): 2 g/h via INTRAVENOUS
  Filled 2020-11-21 (×2): qty 1000

## 2020-11-21 MED ORDER — LIDOCAINE HCL (PF) 1 % IJ SOLN
30.0000 mL | INTRAMUSCULAR | Status: AC | PRN
  Administered 2020-11-22: 30 mL via SUBCUTANEOUS
  Filled 2020-11-21: qty 30

## 2020-11-21 MED ORDER — MAGNESIUM SULFATE BOLUS VIA INFUSION
4.0000 g | Freq: Once | INTRAVENOUS | Status: AC
  Administered 2020-11-22: 4 g via INTRAVENOUS
  Filled 2020-11-21: qty 1000

## 2020-11-21 MED ORDER — MISOPROSTOL 50MCG HALF TABLET
50.0000 ug | ORAL_TABLET | ORAL | Status: DC
  Filled 2020-11-21: qty 1

## 2020-11-21 MED ORDER — MISOPROSTOL 50MCG HALF TABLET
50.0000 ug | ORAL_TABLET | Freq: Once | ORAL | Status: AC
  Administered 2020-11-21: 50 ug via ORAL
  Filled 2020-11-21: qty 1

## 2020-11-21 MED ORDER — LABETALOL HCL 200 MG PO TABS
400.0000 mg | ORAL_TABLET | Freq: Three times a day (TID) | ORAL | Status: DC
  Filled 2020-11-21: qty 2

## 2020-11-21 MED ORDER — LACTATED RINGERS IV SOLN
500.0000 mL | INTRAVENOUS | Status: DC | PRN
  Administered 2020-11-22: 04:00:00 300 mL via INTRAVENOUS

## 2020-11-21 MED ORDER — OXYCODONE-ACETAMINOPHEN 5-325 MG PO TABS: 1.0000 | ORAL_TABLET | ORAL | Status: DC | PRN

## 2020-11-21 MED ORDER — OXYTOCIN-SODIUM CHLORIDE 30-0.9 UT/500ML-% IV SOLN
2.5000 [IU]/h | INTRAVENOUS | Status: DC
  Administered 2020-11-22: 06:00:00 2.5 [IU]/h via INTRAVENOUS

## 2020-11-21 MED ORDER — SOD CITRATE-CITRIC ACID 500-334 MG/5ML PO SOLN: 30.0000 mL | ORAL | Status: DC | PRN

## 2020-11-21 MED ORDER — OXYTOCIN-SODIUM CHLORIDE 30-0.9 UT/500ML-% IV SOLN
1.0000 m[IU]/min | INTRAVENOUS | Status: DC
  Administered 2020-11-21: 2 m[IU]/min via INTRAVENOUS
  Filled 2020-11-21: qty 500

## 2020-11-21 MED ORDER — LABETALOL HCL 5 MG/ML IV SOLN
40.0000 mg | INTRAVENOUS | Status: DC | PRN
Start: 2020-11-21 — End: 2020-11-24
  Administered 2020-11-22 (×2): 40 mg via INTRAVENOUS
  Filled 2020-11-21 (×2): qty 8

## 2020-11-21 MED ORDER — LACTATED RINGERS IV SOLN: INTRAVENOUS | Status: DC

## 2020-11-21 MED ORDER — ONDANSETRON HCL 4 MG/2ML IJ SOLN: 4.0000 mg | Freq: Four times a day (QID) | INTRAMUSCULAR | Status: DC | PRN

## 2020-11-21 MED ORDER — ACETAMINOPHEN 325 MG PO TABS: 650.0000 mg | ORAL_TABLET | ORAL | Status: DC | PRN

## 2020-11-21 MED ORDER — HYDRALAZINE HCL 20 MG/ML IJ SOLN: 10.0000 mg | INTRAMUSCULAR | Status: DC | PRN

## 2020-11-21 MED ORDER — OXYTOCIN BOLUS FROM INFUSION
333.0000 mL | Freq: Once | INTRAVENOUS | Status: AC
  Administered 2020-11-22: 05:00:00 333 mL via INTRAVENOUS

## 2020-11-21 MED ORDER — FENTANYL CITRATE (PF) 100 MCG/2ML IJ SOLN
50.0000 ug | INTRAMUSCULAR | Status: DC | PRN
  Administered 2020-11-21 – 2020-11-22 (×2): 50 ug via INTRAVENOUS
  Filled 2020-11-21 (×2): qty 2

## 2020-11-21 MED ORDER — TERBUTALINE SULFATE 1 MG/ML IJ SOLN: 0.2500 mg | Freq: Once | INTRAMUSCULAR | Status: DC | PRN

## 2020-11-21 MED ORDER — MISOPROSTOL 25 MCG QUARTER TABLET: 25.0000 ug | ORAL_TABLET | ORAL | Status: DC | PRN

## 2020-11-21 MED ORDER — LABETALOL HCL 5 MG/ML IV SOLN
80.0000 mg | INTRAVENOUS | Status: DC | PRN
  Administered 2020-11-22: 07:00:00 80 mg via INTRAVENOUS
  Filled 2020-11-21: qty 16

## 2020-11-21 MED ORDER — LABETALOL HCL 200 MG PO TABS
400.0000 mg | ORAL_TABLET | Freq: Three times a day (TID) | ORAL | Status: DC
  Administered 2020-11-22: 400 mg via ORAL
  Filled 2020-11-21 (×3): qty 2

## 2020-11-21 MED ORDER — LABETALOL HCL 5 MG/ML IV SOLN
20.0000 mg | INTRAVENOUS | Status: DC | PRN
  Administered 2020-11-21 – 2020-11-22 (×2): 20 mg via INTRAVENOUS
  Filled 2020-11-21 (×2): qty 4
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
## 2020-11-21 NOTE — Progress Notes (Signed)
Admission orders placed.  Sheila Oats, MD OB Fellow, Faculty Practice 11/21/2020 12:38 PM

## 2020-11-21 NOTE — Progress Notes (Signed)
Labor Progress Note [name] is a 39 y.o. [phone] at [redacted]w[redacted]d presented for IOL secondary to oligohydramnios. S: Patient doing well, no concerns at this time.   O:  BP 124/83   Pulse 87   Temp 98 F (36.7 C) (Oral)   Resp 18   Ht 4\' 10"  (1.473 m)   Wt 81.6 kg   LMP 02/15/2020 (Approximate)   SpO2 99%   BMI 37.62 kg/m  EFM: 140 bpm/moderate variability/accels with few previous variable decels   CVE: Dilation: Fingertip Effacement (%): 50 Station: -3 Exam by:: Devaney RN   A&P: 39 y.o. 30 [redacted]w[redacted]d presented for IOL secondary to oligohydramnios.  #Labor: Progressing well. FB placed at 1640 and cytotec x1 at 1650. Continue to monitor and initiate pitocin if appropriate. #Pain: prn meds #FWB: category 1, since presence of few variable decels have resolved  #GBS negative  #oligohydramnios: AFI 4-5 cm.  #cHTN: Patient's previous blood pressures have been in moderate ranges with one severe range BP. Most recently normotensive at 124/83. PreE labs notable for Cr 1.04, AST and ALT wnl. Repeat PreE labs following delivery. Continue labetalol and nifedipine.  #psychosocial distress: Patient's husband recently passed away and son committed suicide. Plan for 1 week mood check. Consider SW consult following delivery. #hx LEEP: performed in 10/2016. Most recent PAP in 04/2017 was negative.   05/2017, DO 9:24 PM

## 2020-11-21 NOTE — Progress Notes (Signed)
Malli called RN to bedside. RN noted small puddle of bright red blood on the floor and a small trail leading to the bathroom. RN lightly tugged on foley balloon, and still in place. RN notified provider of bleeding and placement of balloon. Strip reviewed by provider.

## 2020-11-21 NOTE — Telephone Encounter (Signed)
OB Telephone note Patient called with interpreter that she can come to L&D anytime for her IOL for FGR. Pt amenable to plan and to come after she picks her daughter up from school and drops her off at home. L&D aware  [name] MD Attending Center for [employer] (Faculty Practice) 11/21/2020 Time: 1215pm

## 2020-11-22 ENCOUNTER — Inpatient Hospital Stay (HOSPITAL_COMMUNITY): Payer: Medicaid Other | Admitting: Anesthesiology

## 2020-11-22 ENCOUNTER — Other Ambulatory Visit: Payer: Self-pay

## 2020-11-22 ENCOUNTER — Encounter (HOSPITAL_COMMUNITY): Payer: Self-pay | Admitting: Obstetrics and Gynecology

## 2020-11-22 DIAGNOSIS — O4103X0 Oligohydramnios, third trimester, not applicable or unspecified: Secondary | ICD-10-CM

## 2020-11-22 DIAGNOSIS — O1410 Severe pre-eclampsia, unspecified trimester: Secondary | ICD-10-CM

## 2020-11-22 DIAGNOSIS — Z3A37 37 weeks gestation of pregnancy: Secondary | ICD-10-CM

## 2020-11-22 LAB — CBC
HCT: 37.6 % (ref 36.0–46.0)
HCT: 39.9 % (ref 36.0–46.0)
Hemoglobin: 12.5 g/dL (ref 12.0–15.0)
Hemoglobin: 13.2 g/dL (ref 12.0–15.0)
MCH: 32.3 pg (ref 26.0–34.0)
MCH: 32.3 pg (ref 26.0–34.0)
MCHC: 33.1 g/dL (ref 30.0–36.0)
MCHC: 33.2 g/dL (ref 30.0–36.0)
MCV: 97.2 fL (ref 80.0–100.0)
MCV: 97.6 fL (ref 80.0–100.0)
Platelets: 207 10*3/uL (ref 150–400)
Platelets: 227 10*3/uL (ref 150–400)
RBC: 3.87 MIL/uL (ref 3.87–5.11)
RBC: 4.09 MIL/uL (ref 3.87–5.11)
RDW: 13.1 % (ref 11.5–15.5)
RDW: 13.2 % (ref 11.5–15.5)
WBC: 13.3 10*3/uL — ABNORMAL HIGH (ref 4.0–10.5)
WBC: 19.3 10*3/uL — ABNORMAL HIGH (ref 4.0–10.5)
nRBC: 0 % (ref 0.0–0.2)
nRBC: 0 % (ref 0.0–0.2)

## 2020-11-22 LAB — COMPREHENSIVE METABOLIC PANEL
ALT: 40 U/L (ref 0–44)
AST: 37 U/L (ref 15–41)
Albumin: 2.8 g/dL — ABNORMAL LOW (ref 3.5–5.0)
Alkaline Phosphatase: 221 U/L — ABNORMAL HIGH (ref 38–126)
Anion gap: 8 (ref 5–15)
BUN: 19 mg/dL (ref 6–20)
CO2: 20 mmol/L — ABNORMAL LOW (ref 22–32)
Calcium: 8.8 mg/dL — ABNORMAL LOW (ref 8.9–10.3)
Chloride: 105 mmol/L (ref 98–111)
Creatinine, Ser: 0.77 mg/dL (ref 0.44–1.00)
GFR, Estimated: >60 mL/min (ref >60)
Glucose, Bld: 96 mg/dL (ref 70–99)
Potassium: 4.3 mmol/L (ref 3.5–5.1)
Sodium: 133 mmol/L — ABNORMAL LOW (ref 135–145)
Total Bilirubin: 0.5 mg/dL (ref 0.3–1.2)
Total Protein: 6.2 g/dL — ABNORMAL LOW (ref 6.5–8.1)

## 2020-11-22 LAB — RPR: RPR Ser Ql: NONREACTIVE

## 2020-11-22 MED ORDER — SODIUM CHLORIDE (PF) 0.9 % IJ SOLN
INTRAMUSCULAR | Status: DC | PRN
  Administered 2020-11-22: 12 mL/h via EPIDURAL

## 2020-11-22 MED ORDER — IBUPROFEN 600 MG PO TABS: 600.0000 mg | ORAL_TABLET | Freq: Four times a day (QID) | ORAL | Status: DC

## 2020-11-22 MED ORDER — ONDANSETRON HCL 4 MG PO TABS: 4.0000 mg | ORAL_TABLET | ORAL | Status: DC | PRN

## 2020-11-22 MED ORDER — PHENYLEPHRINE 40 MCG/ML (10ML) SYRINGE FOR IV PUSH (FOR BLOOD PRESSURE SUPPORT)
PREFILLED_SYRINGE | INTRAVENOUS | Status: AC
  Administered 2020-11-22: 04:00:00 80 ug
  Filled 2020-11-22: qty 10

## 2020-11-22 MED ORDER — SIMETHICONE 80 MG PO CHEW: 80.0000 mg | CHEWABLE_TABLET | ORAL | Status: DC | PRN

## 2020-11-22 MED ORDER — SENNOSIDES-DOCUSATE SODIUM 8.6-50 MG PO TABS: 2.0000 | ORAL_TABLET | Freq: Every evening | ORAL | Status: DC | PRN

## 2020-11-22 MED ORDER — DIPHENHYDRAMINE HCL 25 MG PO CAPS: 25.0000 mg | ORAL_CAPSULE | Freq: Four times a day (QID) | ORAL | Status: DC | PRN

## 2020-11-22 MED ORDER — BENZOCAINE-MENTHOL 20-0.5 % EX AERO
1.0000 "application " | INHALATION_SPRAY | CUTANEOUS | Status: DC | PRN
  Administered 2020-11-22: 1 via TOPICAL
  Filled 2020-11-22: qty 56

## 2020-11-22 MED ORDER — LACTATED RINGERS IV SOLN: INTRAVENOUS | Status: AC

## 2020-11-22 MED ORDER — SENNOSIDES-DOCUSATE SODIUM 8.6-50 MG PO TABS: 2.0000 | ORAL_TABLET | ORAL | Status: DC

## 2020-11-22 MED ORDER — WITCH HAZEL-GLYCERIN EX PADS: 1.0000 "application " | MEDICATED_PAD | CUTANEOUS | Status: DC | PRN

## 2020-11-22 MED ORDER — LIDOCAINE HCL (PF) 1 % IJ SOLN
INTRAMUSCULAR | Status: DC | PRN
  Administered 2020-11-22: 10 mL via EPIDURAL

## 2020-11-22 MED ORDER — COCONUT OIL OIL
1.0000 "application " | TOPICAL_OIL | Status: DC | PRN
  Administered 2020-11-22: 1 via TOPICAL

## 2020-11-22 MED ORDER — CEFAZOLIN SODIUM-DEXTROSE 2-4 GM/100ML-% IV SOLN
2.0000 g | Freq: Once | INTRAVENOUS | Status: AC
  Administered 2020-11-22: 06:00:00 2 g via INTRAVENOUS
  Filled 2020-11-22: qty 100

## 2020-11-22 MED ORDER — IBUPROFEN 600 MG PO TABS
600.0000 mg | ORAL_TABLET | Freq: Four times a day (QID) | ORAL | Status: DC | PRN
  Administered 2020-11-22 (×2): 600 mg via ORAL
  Filled 2020-11-22 (×3): qty 1

## 2020-11-22 MED ORDER — ACETAMINOPHEN 325 MG PO TABS
650.0000 mg | ORAL_TABLET | ORAL | Status: DC | PRN
  Administered 2020-11-22: 650 mg via ORAL
  Filled 2020-11-22: qty 2

## 2020-11-22 MED ORDER — ONDANSETRON HCL 4 MG/2ML IJ SOLN: 4.0000 mg | INTRAMUSCULAR | Status: DC | PRN

## 2020-11-22 MED ORDER — DOCUSATE SODIUM 100 MG PO CAPS
100.0000 mg | ORAL_CAPSULE | Freq: Two times a day (BID) | ORAL | Status: DC
  Administered 2020-11-23 – 2020-11-24 (×3): 100 mg via ORAL
  Filled 2020-11-22 (×3): qty 1

## 2020-11-22 MED ORDER — PRENATAL MULTIVITAMIN CH
1.0000 | ORAL_TABLET | Freq: Every day | ORAL | Status: DC
  Administered 2020-11-22 – 2020-11-24 (×3): 1 via ORAL
  Filled 2020-11-22 (×3): qty 1

## 2020-11-22 MED ORDER — FENTANYL-BUPIVACAINE-NACL 0.5-0.125-0.9 MG/250ML-% EP SOLN
EPIDURAL | Status: AC
  Filled 2020-11-22: qty 250

## 2020-11-22 MED ORDER — NIFEDIPINE ER OSMOTIC RELEASE 30 MG PO TB24
60.0000 mg | ORAL_TABLET | Freq: Every day | ORAL | Status: DC
  Administered 2020-11-23 – 2020-11-24 (×2): 60 mg via ORAL
  Filled 2020-11-22 (×2): qty 2

## 2020-11-22 MED ORDER — TETANUS-DIPHTH-ACELL PERTUSSIS 5-2.5-18.5 LF-MCG/0.5 IM SUSY: 0.5000 mL | PREFILLED_SYRINGE | Freq: Once | INTRAMUSCULAR | Status: DC

## 2020-11-22 MED ORDER — NIFEDIPINE ER OSMOTIC RELEASE 60 MG PO TB24
60.0000 mg | ORAL_TABLET | Freq: Once | ORAL | Status: AC
  Administered 2020-11-22: 07:00:00 60 mg via ORAL
  Filled 2020-11-22: qty 1

## 2020-11-22 MED ORDER — DIBUCAINE (PERIANAL) 1 % EX OINT: 1.0000 "application " | TOPICAL_OINTMENT | CUTANEOUS | Status: DC | PRN

## 2020-11-22 NOTE — Lactation Note (Signed)
This note was copied from a baby's chart. Lactation Consultation Note  Patient Name: [name] Date: 11/22/2020 Reason for consult: Initial assessment  Initial visit to 11 hours old ETI of a P4 mother. Mother has breastfeed three older children, 2 of them for 3 months and youngest child for 15 months ~12 years ago. Mother plans to breastfeed this child but she is open to supplementation as needed due to child needs.  FOB is supportive and present. Infant is showing hunger cues and mother is getting ready to breastfeed. Reviewed hand expressing technique. Unable to expressed colostrum. Offered assistance with latch and unswaddled infant. Noted stool and LC changed diaper. Mother latched infant with ease to right breast, cradle position. No audible swallows. Talked to mother about limiting breastfeeding session to to preserve infant's energy. Encouraged parents to supplement since infant is <6lbs and ET. Shared supplementation alternatives as well as using a DEBP for stimulation. Mother agreed to use a DEBP and formula supplementation.   Set up DEBP and assisted mother with pumping. Flange size 21 mm. Few drops of colostrum collected on flange. Explained pump basics/cleaning/frequency. FOB offered to bottle-feed. Praised FOB. Demonstrated pacing, upright position and frequent burping. Infant took 51mL of 22-cal formula.   Promoted maternal rest, hydration and food intake. Encouraged to contact [facility] for support when ready to breastfeed baby and recommended to request help for questions or concerns. Provided LC services brochure.    Plan: 1-Breastfeed no longer than 30 minutes to preserve energy 2-Pump using initiation setting or hand express to supplement 3-Spoon or fingerfeed EBM  4-Pacing and upright position when bottle-feeding formula following LPTI volume guideline 5-Contact LC for support   All questions answered at this time.    Maternal Data Formula  Feeding for Exclusion: No Has patient been taught Hand Expression?: Yes Does the patient have breastfeeding experience prior to this delivery?: Yes  Feeding Feeding Type: Breast Fed  LATCH Score Latch: Grasps breast easily, tongue down, lips flanged, rhythmical sucking.  Audible Swallowing: None  Type of Nipple: Everted at rest and after stimulation  Comfort (Breast/Nipple): Soft / non-tender  Hold (Positioning): No assistance needed to correctly position infant at breast.  LATCH Score: 8  Interventions Interventions: Breast feeding basics reviewed;Assisted with latch;Skin to skin;Breast massage;Hand express;Adjust position;Support pillows;Expressed milk;DEBP  Lactation Tools Discussed/Used Tools: Pump Breast pump type: Double-Electric Breast Pump WIC Program: Yes Pump Review: Setup, frequency, and cleaning;Milk Storage Initiated by:: Esti Demello IBCLC Date initiated:: 11/22/20   Consult Status Consult Status: Follow-up Date: 11/23/20 Follow-up type: In-patient    Melainie Krinsky A Higuera Ancidey 11/22/2020, 4:35 PM

## 2020-11-22 NOTE — Progress Notes (Signed)
MD at bedside and agrees that BP is related to Pain; RN will reassess.

## 2020-11-22 NOTE — Progress Notes (Signed)
Labor Progress Note [name] is a 39 y.o. [phone] at [redacted]w[redacted]d presented for IOL secondary to oligohydramnios.  S: Patient breathing through contractions   O:  BP (!) 160/98   Pulse 82   Temp 98 F (36.7 C) (Oral)   Resp 19   Ht 4\' 10"  (1.473 m)   Wt 81.6 kg   LMP 02/15/2020 (Approximate)   SpO2 99%   BMI 37.62 kg/m  EFM: 125 bpm/moderate variability/pos accels/variable decel following AROM, resolved   CVE: Dilation: 7 Effacement (%): 80 Station: -2 Exam by:: Rhydian Baldi MD    A&P: 39 y.o. 30 [redacted]w[redacted]d presented for IOL secondary to oligohydramnios.   #Labor: s/p FB and cytotec. Pitocin started at 2300, running at 6. AROM at 0330 for dark blood tinged fluid. No active bleeding noted. Will continue to monitor closely.    #cHTN, now with superimposed PEC w SF: Continue labetalol TID and procardia. Started on Mag at 0025, repeat PEC labs stable.   #Pain: prn meds, epidural  #FWB: category 1  #GBS negative     0026, MD 3:33 AM

## 2020-11-22 NOTE — Anesthesia Preprocedure Evaluation (Signed)
Anesthesia Evaluation  Patient identified by MRN, date of birth, ID band Patient awake    Reviewed: Allergy & Precautions, H&P , NPO status , Patient's Chart, lab work & pertinent test results  Airway Mallampati: II       Dental no notable dental hx.    Pulmonary neg pulmonary ROS,    Pulmonary exam normal        Cardiovascular hypertension, Pt. on medications negative cardio ROS Normal cardiovascular exam     Neuro/Psych negative neurological ROS  negative psych ROS   GI/Hepatic negative GI ROS, Neg liver ROS,   Endo/Other  negative endocrine ROS  Renal/GU negative Renal ROS  negative genitourinary   Musculoskeletal negative musculoskeletal ROS (+)   Abdominal (+) + obese,   Peds  Hematology negative hematology ROS (+)   Anesthesia Other Findings   Reproductive/Obstetrics (+) Pregnancy                             Anesthesia Physical Anesthesia Plan  ASA: II  Anesthesia Plan: Epidural   Post-op Pain Management:    Induction:   PONV Risk Score and Plan:   Airway Management Planned:   Additional Equipment:   Intra-op Plan:   Post-operative Plan:   Informed Consent: I have reviewed the patients History and Physical, chart, labs and discussed the procedure including the risks, benefits and alternatives for the proposed anesthesia with the patient or authorized representative who has indicated his/her understanding and acceptance.       Plan Discussed with:   Anesthesia Plan Comments:         Anesthesia Quick Evaluation

## 2020-11-22 NOTE — Discharge Summary (Signed)
Postpartum Discharge Summary  Date of Service updated 11/24/20     Patient Name: [name] DOB: 1981-02-04 MRN: [id number]  Date of admission: 11/21/2020 Delivery date:11/22/2020  Delivering provider: Janet Berlin  Date of discharge: 11/24/2020  Admitting diagnosis: Supervision of high-risk pregnancy [O09.90] Intrauterine pregnancy: [redacted]w[redacted]d    Secondary diagnosis:  Active Problems:   AMA (advanced maternal age) multigravida 35+   Hypertension affecting pregnancy, antepartum   Psychosocial distress   Essential hypertension   IUGR (intrauterine growth restriction) affecting care of mother   Oligohydramnios   Supervision of high-risk pregnancy   Preeclampsia, severe  Additional problems: n/a    Discharge diagnosis: Term Pregnancy Delivered, Preeclampsia (severe) and CHTN with superimposed preeclampsia                                              Post partum procedures:manual placenta extraction magnesium sulfate Augmentation: AROM, Pitocin, Cytotec and IP Foley Complications: None  Hospital course: Induction of Labor With Vaginal Delivery   39y.o. yo [phone] admitted to the hospital 11/21/2020 for induction of labor.  Indication for induction: oligohydramnios, FGR, cHTN .  Patient had an uncomplicated labor course as follows: Membrane Rupture Time/Date: 3:30 AM ,11/22/2020   Delivery Method:Vaginal, Vacuum (Extractor)  Episiotomy: None  Lacerations:  2nd degree;Perineal  Details of delivery can be found in separate delivery note.  Patient had a routine postpartum course. Patient is discharged home 11/24/20.  Newborn Data: Birth date:11/22/2020  Birth time:5:23 AM  Gender:Female  Living status:Living  Apgars:5 ,7  Weight:2430 g   Magnesium Sulfate received: Yes: Seizure prophylaxis BMZ received: No Rhophylac:N/A MMJYN:WGNFAefore discharge T-DaP:Given prenatally Flu: Yes Transfusion:No  Physical exam  Vitals:   11/23/20 2256  11/24/20 0350 11/24/20 0806 11/24/20 1114  BP: (!) 156/89 (!) 142/78 (!) 152/90 135/72  Pulse: 76 81 89 79  Resp: 18 16 18 16   Temp: 98.5 F (36.9 C) 98.1 F (36.7 C) 98.4 F (36.9 C)   TempSrc: Oral Oral Oral   SpO2: 99% 100% 100% 98%  Weight:      Height:      BP before discharge 135/72 General: alert, cooperative and no distress Lochia: appropriate Uterine Fundus: firm Incision: N/A DVT Evaluation: No evidence of DVT seen on physical exam. No cords or calf tenderness. Labs: Lab Results  Component Value Date   WBC 19.3 (H) 11/22/2020   HGB 12.5 11/22/2020   HCT 37.6 11/22/2020   MCV 97.2 11/22/2020   PLT 207 11/22/2020   CMP Latest Ref Rng & Units 11/22/2020  Glucose 70 - 99 mg/dL 96  BUN 6 - 20 mg/dL 19  Creatinine 0.44 - 1.00 mg/dL 0.77  Sodium 135 - 145 mmol/L 133(L)  Potassium 3.5 - 5.1 mmol/L 4.3  Chloride 98 - 111 mmol/L 105  CO2 22 - 32 mmol/L 20(L)  Calcium 8.9 - 10.3 mg/dL 8.8(L)  Total Protein 6.5 - 8.1 g/dL 6.2(L)  Total Bilirubin 0.3 - 1.2 mg/dL 0.5  Alkaline Phos 38 - 126 U/L 221(H)  AST 15 - 41 U/L 37  ALT 0 - 44 U/L 40   Edinburgh Score: Edinburgh Postnatal Depression Scale Screening Tool 11/24/2020  I have been able to laugh and see the funny side of things. (No Data)     After visit meds:  Allergies as of 11/24/2020  Reactions   Latex Rash   Tape Rash      Medication List    STOP taking these medications   aspirin 81 MG chewable tablet   labetalol 200 MG tablet Commonly known as: NORMODYNE   ondansetron 4 MG disintegrating tablet Commonly known as: Zofran ODT     TAKE these medications   calcium-vitamin D 250-100 MG-UNIT tablet Take 1 tablet by mouth 2 (two) times daily.   ibuprofen 600 MG tablet Commonly known as: ADVIL Take 1 tablet (600 mg total) by mouth every 6 (six) hours as needed for moderate pain, mild pain, headache or cramping.   measles, mumps & rubella vaccine injection Commonly known as: MMR Inject 0.5  mLs into the skin once for 1 dose.   NIFEdipine 30 MG 24 hr tablet Commonly known as: PROCARDIA-XL/NIFEDICAL-XL Take 1 tablet (30 mg total) by mouth at bedtime. Can increase to twice a day as needed for symptomatic contractions What changed: You were already taking a medication with the same name, and this prescription was added. Make sure you understand how and when to take each.   NIFEdipine 60 MG 24 hr tablet Commonly known as: ADALAT CC Take 1 tablet (60 mg total) by mouth daily. Start taking on: November 25, 2020 What changed:   medication strength  how much to take  additional instructions   PNV PO Take by mouth.        Discharge home in stable condition Infant Feeding: Breast Infant Disposition:home with mother Discharge instruction: per After Visit Summary and Postpartum booklet. Activity: Advance as tolerated. Pelvic rest for 6 weeks.  Diet: routine diet Future Appointments: No future appointments. Follow up Visit:  [place] [facility] [place] at [facility]. Schedule an appointment as soon as possible for a visit in 1 week(s).   Specialty: Obstetrics and Gynecology Why: blood pressure check, [facility] patient Contact information: [address] [place] [address] [phone]               Please schedule this patient for a In person postpartum visit in 4 weeks with the following provider: MD. Additional Postpartum F/U:Postpartum Depression checkup and BP check 1 week, PP pap at 4 wk visit  High risk pregnancy complicated by: Graylon Good w SIPEC w SF, social stressors,  Delivery mode:  Vaginal, Vacuum Neurosurgeon)  Anticipated Birth Control:  IUD through [facility]   11/24/2020 Griffin Basil, MD

## 2020-11-22 NOTE — Anesthesia Postprocedure Evaluation (Signed)
Anesthesia Post Note  Patient: [name]  Procedure(s) Performed: AN AD HOC LABOR EPIDURAL     Patient location during evaluation: [facility] Unit Anesthesia Type: Epidural Level of consciousness: awake and alert, oriented and patient cooperative Pain management: pain level controlled Vital Signs Assessment: post-procedure vital signs reviewed and stable Respiratory status: spontaneous breathing Cardiovascular status: stable Postop Assessment: no headache, epidural receding, patient able to bend at knees and no signs of nausea or vomiting Anesthetic complications: no Comments: Report received from NT at patient's bedside.  NT reports patient walked to bathroom and is eating/drinking.  No c/o headache.    No complications documented.  Last Vitals:  Vitals:   11/22/20 1153 11/22/20 1255  BP: 131/79   Pulse: 89   Resp: 18 18  Temp: 36.8 C   SpO2: 98%     Last Pain:  Vitals:   11/22/20 1259  TempSrc:   PainSc: 7    Pain Goal: Patients Stated Pain Goal: 0 (11/22/20 1259)                 Merrilyn Puma

## 2020-11-22 NOTE — Progress Notes (Signed)
CSW acknowledged consult and attempted to meet with MOB. However,  MOB is on magnesium sulfate 40 grams  until 11/23/20, 0530.  CSW will meet with MOB at a later time.  Jennelle Pinkstaff D. Dortha Kern, MSW, LCSW Clinical Social Worker [phone]

## 2020-11-22 NOTE — Anesthesia Postprocedure Evaluation (Deleted)
Anesthesia Post Note  Patient: [name]  Procedure(s) Performed: AN AD HOC LABOR EPIDURAL     Patient location during evaluation: Mother Baby Anesthesia Type: Epidural Level of consciousness: awake and alert, oriented and patient cooperative Pain management: pain level controlled Vital Signs Assessment: post-procedure vital signs reviewed and stable Respiratory status: spontaneous breathing Cardiovascular status: stable Postop Assessment: no headache, epidural receding, patient able to bend at knees and no signs of nausea or vomiting Anesthetic complications: no Comments: Report received from NT.  Pt. Walked to restroom.  Pt eating.  No c/o headache.     No complications documented.  Last Vitals:  Vitals:   11/22/20 1153 11/22/20 1255  BP: 131/79   Pulse: 89   Resp: 18 18  Temp: 36.8 C   SpO2: 98%     Last Pain:  Vitals:   11/22/20 1259  TempSrc:   PainSc: 7    Pain Goal: Patients Stated Pain Goal: 0 (11/22/20 1259)                 Merrilyn Puma

## 2020-11-22 NOTE — Progress Notes (Signed)
Labor Progress Note [name] is a 39 y.o. [phone] at [redacted]w[redacted]d presented for IOL secondary to oligohydramnios. S: Patient feeling well overall   O:  BP (!) 156/82    Pulse 89    Temp 98 F (36.7 C) (Oral)    Resp 18    Ht 4\' 10"  (1.473 m)    Wt 81.6 kg    LMP 02/15/2020 (Approximate)    SpO2 99%    BMI 37.62 kg/m  EFM: 140 bpm/moderate variability/accels/no decels  CVE: Dilation: 5 Effacement (%): 60 Station: -3 Exam by:: [email or web address] RNC   A&P: 39 y.o. 30 [redacted]w[redacted]d presented for IOL secondary to oligohydramnios.   #Labor: s/p FB and cytotec. Pitocin started at 2300, running at 4.   #cHTN, now with superimposed PEC w SF: Multiple SR pressures over past few hours, s/p IV labetalol. Will proceed with magnesium at this time. Repeat PEC labs pending. Continue labetalol TID and procardia.    #Pain: prn meds #FWB: category 1  #GBS negative     [redacted]w[redacted]d, MD 12:09 AM

## 2020-11-22 NOTE — Anesthesia Procedure Notes (Signed)
Epidural Patient location during procedure: OB Start time: 11/22/2020 3:54 AM End time: 11/22/2020 3:57 AM  Staffing Anesthesiologist: Leilani Able, MD Performed: anesthesiologist   Preanesthetic Checklist Completed: patient identified, IV checked, site marked, risks and benefits discussed, surgical consent, monitors and equipment checked, pre-op evaluation and timeout performed  Epidural Patient position: sitting Prep: DuraPrep and site prepped and draped Patient monitoring: continuous pulse ox and blood pressure Approach: midline Location: L3-L4 Injection technique: LOR air  Needle:  Needle type: Tuohy  Needle gauge: 17 G Needle length: 9 cm and 9 Needle insertion depth: 8 cm Catheter type: closed end flexible Catheter size: 19 Gauge Catheter at skin depth: 13 cm Test dose: negative and Other  Assessment Events: blood not aspirated, injection not painful, no injection resistance, no paresthesia and negative IV test  Additional Notes Reason for block:procedure for pain

## 2020-11-23 DIAGNOSIS — O1495 Unspecified pre-eclampsia, complicating the puerperium: Secondary | ICD-10-CM

## 2020-11-23 DIAGNOSIS — O115 Pre-existing hypertension with pre-eclampsia, complicating the puerperium: Secondary | ICD-10-CM

## 2020-11-23 NOTE — Lactation Note (Signed)
This note was copied from a baby's chart. Lactation Consultation Note  Patient Name: [name] [id number] Date: 11/23/2020 Reason for consult: Follow-up assessment;Early term 37-38.6wks  LC to room for f/u visit. Infant's output and wt loss are within normal limits. Mom continues to bf followed by formula supplementation. She denies breastfeeding difficulty. Patient was provided with the opportunity to ask questions. All concerns were addressed. Video interpreter services used to communicate in mother's preferred language. Will plan follow up visit.   Consult Status Consult Status: Follow-up Follow-up type: In-patient   Elder Negus, MA IBCLC 11/23/2020, 2:33 PM
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
## 2020-11-23 NOTE — Progress Notes (Signed)
Post Partum Day 1 Subjective: Patient reports feeling well and is without complaints. She is tolerating a regular diet. She denies HA, visual changes, RUQ/epigastric pain  Objective: Blood pressure (!) 150/81, pulse 88, temperature 98.1 F (36.7 C), temperature source Oral, resp. rate 18, height 4\' 10"  (1.473 m), weight 81.6 kg, last menstrual period 02/15/2020, SpO2 99 %, unknown if currently breastfeeding.  Physical Exam:  General: alert, cooperative and no distress Lochia: appropriate Uterine Fundus: firm DVT Evaluation: No evidence of DVT seen on physical exam.  Recent Labs    11/22/20 0026 11/22/20 0750  HGB 13.2 12.5  HCT 39.9 37.6    Assessment/Plan: Patient completed magnesium sulfate for seizure prophylaxis give CHTN with superimposed pre-eclampsia Procardia to start today Continue monitoring BP Routine postpartum care   LOS: 2 days   Gearl Kimbrough 11/23/2020, 7:55 AM

## 2020-11-24 ENCOUNTER — Other Ambulatory Visit (HOSPITAL_COMMUNITY): Payer: Self-pay | Admitting: Obstetrics and Gynecology

## 2020-11-24 DIAGNOSIS — O1093 Unspecified pre-existing hypertension complicating the puerperium: Secondary | ICD-10-CM

## 2020-11-24 DIAGNOSIS — O115 Pre-existing hypertension with pre-eclampsia, complicating the puerperium: Secondary | ICD-10-CM

## 2020-11-24 MED ORDER — MEASLES, MUMPS & RUBELLA VAC IJ SOLR
0.5000 mL | Freq: Once | INTRAMUSCULAR | Status: AC
  Administered 2020-11-24: 13:00:00 0.5 mL via SUBCUTANEOUS
  Filled 2020-11-24: qty 0.5

## 2020-11-24 MED ORDER — IBUPROFEN 600 MG PO TABS: 600.0000 mg | ORAL_TABLET | Freq: Four times a day (QID) | ORAL | 0 refills | Status: DC | PRN

## 2020-11-24 MED ORDER — MEASLES, MUMPS & RUBELLA VAC IJ SOLR: 0.5000 mL | Freq: Once | INTRAMUSCULAR | 0 refills | Status: AC

## 2020-11-24 MED ORDER — NIFEDIPINE ER 60 MG PO TB24: 60.0000 mg | ORAL_TABLET | Freq: Every day | ORAL | 3 refills | Status: DC

## 2020-11-24 MED ORDER — NIFEDIPINE ER OSMOTIC RELEASE 30 MG PO TB24: 30.0000 mg | ORAL_TABLET | Freq: Every evening | ORAL | 2 refills | Status: DC

## 2020-11-24 MED ORDER — NIFEDIPINE ER OSMOTIC RELEASE 30 MG PO TB24: ORAL_TABLET | ORAL | 2 refills | Status: DC

## 2020-11-24 MED FILL — IBUPROFEN 600 MG TABLET: 600 | 7 days supply | Qty: 30 | Fill #0

## 2020-11-24 MED FILL — NIFEdipine ER 30 MG TB24: 30 | 30 days supply | Qty: 90 | Fill #0

## 2020-11-24 NOTE — Lactation Note (Signed)
This note was copied from a baby's chart. Lactation Consultation Note  Patient Name: Girl Jorgia Manthei [id number] Date: 11/24/2020 Reason for consult: Follow-up assessment LC to room for f/u visit. Mom may d/c today. Infant will likely d/c tomorrow. Mom continues to bf on demand followed by MD ordered supplementation. Infant's wt loss and output are within normal limits. LC provided a hand pump for use at home prn. D/C ed complete. Patient was provided with the opportunity to ask questions. All concerns were addressed.  F/u prn.   Consult Status Consult Status: PRN Follow-up type: In-patient    Elder Negus, MA IBCLC 11/24/2020, 9:37 AM

## 2020-11-24 NOTE — Discharge Instructions (Signed)
Hipertensin posparto Postpartum Hypertension La hipertensin posparto es el aumento de la presin arterial que se mantiene ms alta de lo normal despus del parto. Puede no darse cuenta de que tiene hipertensin posparto si no le miden la presin arterial con regularidad. En la [employer], la hipertensin posparto desaparece sola, por lo general en la semana posterior al parto. Sin embargo, algunas mujeres requieren tratamiento mdico para prevenir complicaciones graves, como convulsiones o un accidente cerebrovascular. Cules son las causas? Esta afeccin puede ser causada por uno o ms de lo siguiente:  Hipertensin que exista antes del embarazo (hipertensin crnica).  Hipertensin que surge como resultado del embarazo (hipertensin gestacional).  Trastornos hipertensivos Academic librarian (preeclampsia) o convulsiones en las mujeres que tienen hipertensin arterial durante el embarazo (eclampsia).  Una afeccin en la que el hgado, las plaquetas y los glbulos rojos se daan durante el embarazo (sndrome de HELLP).  Una afeccin en la cual la glndula tiroidea produce demasiadas hormonas (hipertiroidismo).  Otros problemas poco frecuentes de los nervios (trastornos neurolgicos) o trastornos de Risk manager. En algunos casos, es posible que la causa se desconozca. Qu incrementa el riesgo? Los siguientes factores pueden hacer que usted sea ms propensa a tener esta afeccin:  Hipertensin crnica. En algunos casos, es posible que esta no se haya diagnosticado antes del embarazo.  Obesidad.  Diabetes tipo 2.  Enfermedad renal.  Antecedentes de preeclampsia o eclampsia.  Otras afecciones mdicas que modifican el nivel de hormonas en el cuerpo (desequilibrio hormonal). Cules son los signos o los sntomas? Al igual que con todos los tipos de hipertensin, la hipertensin posparto puede no causar ningn sntoma. Segn lo alta que est la presin arterial, puede  presentar lo siguiente:  Dolores de [place]. Estos pueden ser leves, moderados o intensos. Tambin pueden ser regulares, constantes o de inicio repentino (cefalea en estallido).  Cambios en la capacidad para ver (cambios visuales).  Mareos.  Falta de aire.  Hinchazn de [employer], los pies, la parte inferior de las piernas o el rostro. En algunos casos, puede tener hinchazn en varias de estas reas.  Palpitaciones o latidos cardacos acelerados.  Dificultad para respirar al Tressie Ellis.  Disminucin en la cantidad de orina que elimina. Otros signos y sntomas poco frecuentes pueden incluir lo siguiente:  Ms sudoracin que la habitual. Esta dura algo ms que [employer] del parto.  Dolor en el pecho.  Mareos repentinos al levantarse despus de haber estado sentada o [place].  Convulsiones.  Nuseas o vmitos.  Dolor abdominal. Cmo se diagnostica? Esta afeccin se puede diagnosticar en funcin de los resultados de un examen fsico, mediciones de la presin arteria, y [place] y [place]. Tambin puede someterse a otras pruebas, como una exploracin por tomografa computarizada (TC) o una resonancia magntica (RM) para Engineer, manufacturing otros problemas de la hipertensin posparto. Cmo se trata? Si la presin arterial est lo suficientemente alta como para requerir tratamiento, las opciones pueden incluir lo siguiente:  Medicamentos para disminuir la presin arterial (antihipertensivos). Dgale al mdico si est amamantando o si planifica hacerlo. Hay muchos medicamentos antihipertensivos que pueden tomarse sin riesgos durante la Market researcher.  Interrupcin de los [employer] puedan causar la hipertensin.  Tratamiento de las enfermedades que causan la hipertensin.  Tratamiento de las complicaciones de la hipertensin, como convulsiones, accidente cerebrovascular o problemas renales. El mdico seguir controlando atentamente la presin arterial hasta que esta se  encuentre en un nivel seguro para usted. Siga estas indicaciones en su  casa:  Tome los medicamentos de venta libre y los recetados solamente como se lo haya indicado el mdico.  Retome sus actividades normales como se lo haya indicado el mdico. Pregntele al mdico qu actividades son seguras para usted.  No consuma ningn producto que contenga nicotina o tabaco, como cigarrillos y Administrator, Civil Service. Si necesita ayuda para dejar de fumar, consulte al mdico.  Concurra a todas las visitas de control como se lo haya indicado el mdico. Esto es importante. Comunquese con un mdico si:  Los sntomas empeoran.  Aparecen nuevos sntomas, por ejemplo: ? Un dolor de cabeza que no mejora. ? Mareos. ? Cambios en la visin. Solicite ayuda de inmediato si:  Presenta hinchazn repentina [employer], los tobillos o el rostro.  Tiene un aumento de peso repentino y rpido.  Tiene dificultad para respirar, dolor en el pecho, latidos cardacos acelerados o palpitaciones cardacas.  Siente dolor intenso en el abdomen.  Tiene sntomas de un accidente cerebrovascular. "BE FAST" es una manera fcil de recordar las principales seales de advertencia de un accidente cerebrovascular: ? B - Balance (equilibrio). Los signos son dificultad repentina para caminar o prdida del equilibrio. ? E - Eyes (ojos). Los signos son dificultad para ver o un cambio repentino en la visin. ? F - Face (rostro). Los signos son debilidad repentina o entumecimiento del rostro, o el rostro o el prpado que se caen hacia un lado. ? A - Arms (brazos). Los signos son debilidad o entumecimiento en un brazo. Esto sucede de repente y generalmente en un lado del cuerpo. ? S - Speech (habla). Los signos son dificultad para hablar, hablar arrastrando las palabras o dificultad para comprender lo que la gente dice. ? T - Time (tiempo). Es tiempo de Freight forwarder a los servicios de Sports administrator. Escriba la hora en la que comenzaron los  sntomas.  Presenta otros signos de accidente cerebrovascular, como los siguientes: ? Dolor de cabeza sbito e intenso que no tiene causa aparente. ? Nuseas o vmitos. ? Convulsiones. Estos sntomas pueden representar un problema grave que constituye Radio broadcast assistant. No espere hasta que los sntomas desaparezcan. Solicite atencin mdica de inmediato. Comunquese con el servicio de emergencias de su localidad (911 en los [place]). No conduzca por sus propios medios [employer] hospital. Resumen  La hipertensin posparto es el aumento de la presin arterial que se mantiene ms alta de lo normal despus del [place].  En la [employer], la hipertensin posparto desaparece sola, por lo general en la semana posterior al parto.  Algunas mujeres requieren tratamiento mdico para prevenir complicaciones graves, como convulsiones o un accidente cerebrovascular. Esta informacin no tiene Theme park manager el consejo del mdico. Asegrese de hacerle al mdico cualquier pregunta que tenga. Document Revised: 03/11/2018 Document Reviewed: 11/20/2017 Elsevier Patient Education  2020 Elsevier Inc. Parto vaginal Vaginal Delivery  Parto vaginal significa que usted da a luz empujando al beb fuera del canal del parto (vagina). Un equipo de proveedores de atencin mdica la ayudar antes, durante y despus del parto vaginal. Las experiencias de los nacimientos son nicas para todas las [place], y Sports administrator y las experiencias de nacimiento varan segn dnde elija dar a luz. Ladell Heads ocurrir cuando llegue al centro de [place] o al hospital? Pollyann Savoy que se inicie el [place] de parto y haya sido admitida en el hospital o centro de parto, el mdico podr hacer lo siguiente:  Revisar sus antecedentes de Psychiatrist y cualquier inquietud que usted pueda tener.  Colocarle una va intravenosa en una de las venas. Esto se podr usar para administrarle lquidos y medicamentos.  Verificar su presin arterial,  pulso, temperatura y frecuencia cardaca (signos vitales).  Verificar si la bolsa de agua (saco amnitico) se ha roto (ruptura).  Hablar con usted sobre su plan de nacimiento y Chiropractor las opciones para Human resources officer. Monitoreo Su mdico puede monitorear las contracciones (monitoreo uterino) y la frecuencia cardaca del beb (monitoreo fetal). Es posible que el monitoreo se necesite realizar:  Con frecuencia, pero no continuamente (intermitentemente).  Todo el tiempo o durante largos perodos a la vez (continuamente). El monitoreo continuo puede ser necesario si: ? Est recibiendo determinados medicamentos, tales como medicamentos para Engineer, materials o para hacer que las contracciones sean ms fuertes. ? Tiene complicaciones durante el embarazo o el [place]. El monitoreo se puede realizar:  Al colocar un estetoscopio especial o un dispositivo manual de monitoreo en el abdomen o verificar los latidos cardacos del beb y comprobar las contracciones.  Al colocar monitores en el abdomen (monitores externos) para Passenger transport manager los latidos cardacos del beb y la frecuencia y duracin de las contracciones.  Al colocar monitores dentro del tero a travs de la vagina (monitores internos) para Passenger transport manager los latidos cardacos del beb y la frecuencia, duracin y fuerza de sus contracciones. Segn el tipo de monitor, Insurance claims handler en el tero o en la cabeza del beb hasta el nacimiento.  Telemetra. Se trata de un tipo de monitoreo continuo que se puede Education officer, environmental con monitores externos o internos. En lugar de Hospital doctor en la cama, usted puede moverse durante Fish farm manager. Examen fsico Su mdico puede realizar exmenes fsicos frecuentes. Esto puede incluir lo siguiente:  Investment banker, operational cmo y dnde el beb est ubicado en el tero.  Verificar el cuello uterino para determinar: ? Si se est afinando o estirando (borrando). ? Si se est abriendo (dilatando). Qu sucede  durante el [place] de parto y [place]?  El [place] de parto y el parto normales se dividen en tres etapas: Etapa 1  Esta es la etapa ms larga del trabajo de [place].  Esta etapa puede durar [employer].  Durante esta etapa, sentir contracciones. En general, las contracciones son leves, infrecuentes e irregulares al principio. Se hacen ms fuertes, ms frecuentes (aproximadamente cada 2 o 3 minutos) y ms regulares a medida que avanza en esta etapa.  Esta etapa finaliza cuando el cuello uterino est completamente dilatado hasta 4 pulgadas (10cm) y completamente borrado. Etapa 2  Esta etapa comienza una vez que el cuello uterino est totalmente borrado y dilatado, y dura hasta el nacimiento del beb.  Esta etapa puede durar de 20 minutos a 2 horas.  Esta es la etapa en la que va a sentir ganas de pujar al beb fuera de la vagina.  Puede sentir un dolor urente y por estiramiento, especialmente cuando la parte ms ancha de la cabeza del beb pasa a travs de la abertura vaginal (coronacin).  Una vez que el beb nace, el cordn umbilical se pinzar y se cortar. Esto ocurre por lo general despus de un perodo de 1 a 2 minutos despus del parto.  Colocarn al beb sobre su pecho desnudo (contacto piel con piel) en una posicin erguida y [place] con [place] abrigada. Observe al beb para detectar seales de hambre, como el reflejo de bsqueda o succin, y acrquelo al pecho para su primera alimentacin. Etapa 3  Esta etapa comienza inmediatamente despus  del nacimiento del beb y finaliza despus de la expulsin de la placenta.  Esta etapa puede durar de 5 a 30 minutos.  Despus del nacimiento del beb, puede sentir contracciones cuando el cuerpo expulsa la placenta y el tero se contrae para Radio broadcast assistant. Qu puedo esperar despus del [place] de parto y [place]?  Una vez que termine el trabajo de [place], se los controlar a usted y al beb atentamente para Warehouse manager la  seguridad de que ambos estn sanos y listos para ir a Higher education careers adviser. Su equipo de atencin Art gallery manager cmo cuidarse y cuidar a su beb.  Usted y el beb permanecern en la misma habitacin (cohabitacin) durante su estada en el hospital. Esto estimular una vinculacin temprana y [place]ales [place].  Puede seguir recibiendo lquidos o medicamentos por va intravenosa.  Se le controlar y Engineer, maintenance (IT) el tero con regularidad (masaje fndico).  Tendr algo de inflamacin y dolor en el abdomen, la vagina y la zona de la piel entre la abertura vaginal y el ano (perineo).  Si se le realiz una incisin cerca de la vagina (episiotoma) o si ha tenido Airline pilot parto, podran indicarle que se coloque compresas fras sobre la episiotoma o Art therapist. Esto ayuda a Engineer, materials y la hinchazn.  Es posible que le den una botella rociadora para que use cuando vaya al bao para higienizarse. Siga los pasos a continuacin para usar la botella rociadora: ? Antes de orinar, llene la botella rociadora con agua tibia. No use agua caliente. ? Despus de Geographical information systems officer, [place] an est sentada en el inodoro, use la botella rociadora para enjuagar el rea alrededor de la uretra y la abertura vaginal. Con esto podr limpiar cualquier rastro de orina y [place]. ? Llene la botella rociadora con agua limpia cada vez que vaya al bao.  Es normal tener hemorragia vaginal despus del [place]. Use un apsito sanitario para el sangrado vaginal y secrecin. Resumen  Parto vaginal significa que usted dar a luz empujando al beb fuera del canal del parto (vagina).  Su mdico puede monitorear las contracciones (monitoreo uterino) y la frecuencia cardaca del beb (monitoreo fetal).  Su mdico puede realizarle un examen fsico.  El trabajo de parto y el parto normales se dividen en tres etapas.  Una vez que termina el [place], se los controlar a usted y al beb atentamente hasta que estn listos  para ir a casa. Esta informacin no tiene Theme park manager el consejo del mdico. Asegrese de hacerle al mdico cualquier pregunta que tenga. Document Revised: 02/08/2018 Document Reviewed: 02/08/2018 Elsevier Patient Education  2020 [employer].

## 2020-11-24 NOTE — Progress Notes (Signed)
CSW acknowledged new consult for psychosocial trauma.  MOB was assessed on 11/23/2020 by weekend CSW for same consult reason. No other needs or further interventions have been identified.  CSW is signing off.  Please re-consult if a need arises or MOB requests.  Blaine Hamper, MSW, LCSW Clinical Social Work [phone]

## 2020-11-25 ENCOUNTER — Ambulatory Visit: Payer: Self-pay

## 2020-11-25 LAB — SURGICAL PATHOLOGY

## 2020-11-25 NOTE — Lactation Note (Signed)
This note was copied from a baby's chart. Lactation Consultation Note  Patient Name: [name] HALPF'X Date: 11/25/2020    P4 mother whose infant is now 3 hours old.  This is an ETI at 37+3 weeks.  Mother's feeding preference is breast/bottle.  Spoke with pediatrician prior to visit in regards to feeding plan after discharge.  Mother has not been pumping consistently or supplementing after every feeding with Neosure 22 calorie formula.  Reviewed goal for feeding plan after discharge to include: breast feeding, supplementing with Neosure 22 calore formula (1-2 oz) and pumping with the DEBP for 15 minutes.  Asked mother if this was a plan she could follow through with and she verbalized understanding.  Stressed the importance of increasing volume supplementation and pumping consistently.    Mother feels like breast feeding is going well.  She desires to obtain a DEBP from [facility].  Referral faxed.  Asked mother to call [facility] prior to discharge to be sure she can obtain the pump today.  Pediatrician requests a can of powdered formula from [facility] until baby is feeding better and increasing weight.  I put this request on the referral.  Mother will follow through with this request.  Mother will continue to feed on cue or at least every three hours if baby does not self awaken.  Engorgement prevention/treatment reviewed.  Mother has a manual pump at bedside.  RN updated.   Maternal Data    Feeding Feeding Type: Breast Fed  LATCH Score                   Interventions    Lactation Tools Discussed/Used WIC Program: Yes   Consult Status Consult Status: Complete Date: 11/25/20 Follow-up type: Call as needed    Carlissa Pesola R Faith Patricelli 11/25/2020, 12:06 PM

## 2020-11-27 ENCOUNTER — Encounter: Payer: Self-pay | Admitting: Obstetrics & Gynecology

## 2020-11-27 ENCOUNTER — Ambulatory Visit: Payer: Self-pay

## 2020-11-27 ENCOUNTER — Other Ambulatory Visit: Payer: Self-pay

## 2020-12-01 ENCOUNTER — Ambulatory Visit (INDEPENDENT_AMBULATORY_CARE_PROVIDER_SITE_OTHER): Payer: Self-pay | Admitting: General Practice

## 2020-12-01 ENCOUNTER — Other Ambulatory Visit: Payer: Self-pay

## 2020-12-01 VITALS — BP 140/90 | HR 98 | Ht 59.0 in | Wt 166.0 lb

## 2020-12-01 DIAGNOSIS — Z0130 Encounter for examination of blood pressure without abnormal findings: Secondary | ICD-10-CM

## 2020-12-01 NOTE — Progress Notes (Signed)
Patient presents to office today for blood pressure check following SVD on 12/11. History of HTN & Pre-E. Patient reports taking Procardia daily, 2 tabs in the morning 1 tab in the evening as prescribed. Patient denies headaches, dizziness or blurry vision. Edema +3 in lower legs today. BP 139/96. Reviewed with Dr Mayford Knife who states BP is not concerning given she is asymptomatic. Discussed with patient and advised she continue BP medication until postpartum visit & inform us if she begins having headaches with dizziness or blurry vision. Patient will follow up at Pp visit on 1/7. Raquel Mora assisted with Spanish interpretation for today's visit.   Chase Caller RN BSN 12/01/20

## 2020-12-04 ENCOUNTER — Encounter: Payer: Self-pay | Admitting: Family Medicine

## 2020-12-04 ENCOUNTER — Other Ambulatory Visit: Payer: Self-pay

## 2020-12-08 ENCOUNTER — Ambulatory Visit: Payer: Self-pay

## 2020-12-10 NOTE — BH Specialist Note (Signed)
Integrated Behavioral Health via Telemedicine Visit  12/10/2020 [name] [id number]  Number of Integrated Behavioral Health visits: 2 Session Start time: 8:20  Session End time: 8:38 Total time: 18  Referring Provider: Nettie Elm, MD Patient/Family location: Home [facility] Provider location: Center for [facility] at [facility]  All persons participating in visit: Patient [name] and [facility] Mahlani Berninger   Types of Service: Individual psychotherapy  I connected with Antony Salmon Zollner and/or Antony Salmon Macari's n/a by Video (Video is Surveyor, mining) and verified that I am speaking with the correct person using two identifiers.Discussed confidentiality: Yes   I discussed the limitations of telemedicine and the availability of in person appointments.  Discussed there is a possibility of technology failure and discussed alternative modes of communication if that failure occurs.  I discussed that engaging in this telemedicine visit, they consent to the provision of behavioral healthcare and the services will be billed under their insurance.  Patient and/or legal guardian expressed understanding and consented to Telemedicine visit: Yes   Presenting Concerns: Patient and/or family reports the following symptoms/concerns: Pt states her primary concerns today are poor appetite postpartum, and "bones in legs ache", especially after trying to stand up after sitting, and wants to know if this is normal postpartum; no other concern at this time.  Duration of problem: Postpartum; Severity of problem: mild  Patient and/or Family's Strengths/Protective Factors: Social connections, Concrete supports in place (healthy food, safe environments, etc.) and Sense of purpose  Goals Addressed: Patient will: 1.  Reduce symptoms of: stress  2.  Increase knowledge and/or ability of: stress reduction  3.  Demonstrate ability to: Increase  healthy adjustment to current life circumstances and Increase adequate support systems for patient/family  Progress towards Goals: Ongoing  Interventions: Interventions utilized:  Supportive Counseling and Link to [facility] Standardized Assessments completed: GAD-7 and PHQ 9  Patient and/or Family Response: Pt agrees to treatment plan  Assessment: Patient currently experiencing Psychosocial stress.   Patient may benefit from supportive counseling today.  Plan: 1. Follow up with behavioral health clinician on : As needed 2. Behavioral recommendations:  -Discuss medical concern with medical provider at today's visit ( bones in legs painful) -Continue to consider registering for and attending online support group at [email or web address] [facility]) 3. Referral(s): Integrated Art gallery manager (In Clinic) and [facility]:  new mom support  I discussed the assessment and treatment plan with the patient and/or parent/guardian. They were provided an opportunity to ask questions and all were answered. They agreed with the plan and demonstrated an understanding of the instructions.   They were advised to call back or seek an in-person evaluation if the symptoms worsen or if the condition fails to improve as anticipated.  Rae Lips, LCSW   Depression screen [facility] 2/9 12/19/2020 12/01/2020 11/20/2020 11/13/2020 11/05/2020  Decreased Interest 0 0 0 1 0  Down, Depressed, Hopeless 0 0 0 0 0  PHQ - 2 Score 0 0 0 1 0  Altered sleeping 0 0 1 1 0  Tired, decreased energy 0 0 1 1 1   Change in appetite 3 0 0 1 0  Feeling bad or failure about yourself  0 0 0 0 0  Trouble concentrating 0 0 0 0 0  Moving slowly or fidgety/restless 0 0 0 0 0  Suicidal thoughts 0 0 0 0 0  PHQ-9 Score 3 0 2 4 1   Some recent data might be hidden  GAD 7 : Generalized Anxiety Score 12/19/2020 12/01/2020 11/20/2020 11/13/2020  Nervous, Anxious, on Edge 0 0 0 0  Control/stop worrying 0 0 0  0  Worry too much - different things 0 0 0 1  Trouble relaxing 0 0 0 0  Restless 0 0 0 0  Easily annoyed or irritable 0 0 0 0  Afraid - awful might happen 0 0 0 1  Total GAD 7 Score 0 0 0 2

## 2020-12-11 ENCOUNTER — Other Ambulatory Visit: Payer: Self-pay

## 2020-12-11 ENCOUNTER — Encounter: Payer: Self-pay | Admitting: Family Medicine

## 2020-12-19 ENCOUNTER — Encounter: Payer: Self-pay | Admitting: Family Medicine

## 2020-12-19 ENCOUNTER — Ambulatory Visit (INDEPENDENT_AMBULATORY_CARE_PROVIDER_SITE_OTHER): Payer: Self-pay | Admitting: Clinical

## 2020-12-19 ENCOUNTER — Other Ambulatory Visit: Payer: Self-pay

## 2020-12-19 ENCOUNTER — Ambulatory Visit (INDEPENDENT_AMBULATORY_CARE_PROVIDER_SITE_OTHER): Payer: Self-pay | Admitting: Family Medicine

## 2020-12-19 DIAGNOSIS — Z658 Other specified problems related to psychosocial circumstances: Secondary | ICD-10-CM

## 2020-12-19 DIAGNOSIS — Z8759 Personal history of other complications of pregnancy, childbirth and the puerperium: Secondary | ICD-10-CM

## 2020-12-19 DIAGNOSIS — I10 Essential (primary) hypertension: Secondary | ICD-10-CM

## 2020-12-19 DIAGNOSIS — O165 Unspecified maternal hypertension, complicating the puerperium: Secondary | ICD-10-CM

## 2020-12-19 DIAGNOSIS — Z9114 Patient's other noncompliance with medication regimen: Secondary | ICD-10-CM

## 2020-12-19 DIAGNOSIS — O1413 Severe pre-eclampsia, third trimester: Secondary | ICD-10-CM

## 2020-12-19 MED ORDER — NIFEDIPINE ER OSMOTIC RELEASE 30 MG PO TB24: 60.0000 mg | ORAL_TABLET | Freq: Every day | ORAL | 1 refills | Status: DC

## 2020-12-19 MED ORDER — NORETHINDRONE 0.35 MG PO TABS: 1.0000 | ORAL_TABLET | Freq: Every day | ORAL | 11 refills | Status: DC

## 2020-12-19 NOTE — Patient Instructions (Signed)
[facility] [address] [place], [place] [address] [phone] (main office) [phone] (Amrit Cress's office)   

## 2020-12-19 NOTE — Patient Instructions (Signed)
Parto vaginal, cuidados de puerperio Postpartum Care After Vaginal Delivery Lea esta informacin sobre cmo cuidarse desde el momento en que nazca su beb y hasta 6 a 12 semanas despus del parto (perodo del posparto). El mdico tambin podr darle instrucciones ms especficas. Comunquese con su mdico si tiene problemas o preguntas. Siga estas indicaciones en su casa: Hemorragia vaginal  Es normal tener un poco de hemorragia vaginal (loquios) despus del parto. Use un apsito sanitario para el sangrado vaginal y secrecin. ? Durante la primera semana despus del parto, la cantidad y el aspecto de los loquios a menudo es similar a las del perodo menstrual. ? Durante las siguientes semanas disminuir gradualmente hasta convertirse en una secrecin seca amarronada o amarillenta. ? En la mayora de las mujeres, los loquios se detienen completamente entre 4 a 6semanas despus del parto. Los sangrados vaginales pueden variar de mujer a mujer.  Cambie los apsitos sanitarios con frecuencia. Observe si hay cambios en el flujo, como: ? Un aumento repentino en el volumen. ? Cambio en el color. ? Cogulos sanguneos grandes.  Si expulsa un cogulo de sangre por la vagina, gurdelo y llame al mdico para informrselo. No deseche los cogulos de sangre por el inodoro antes de hablar con su mdico.  No use tampones ni se haga duchas vaginales hasta que el mdico la autorice.  Si no est amamantando, volver a tener su perodo entre 6 y 8 semanas despus del parto. Si solamente alimenta al beb con leche materna (lactancia materna exclusiva), podra no volver a tener su perodo hasta que deje de amamantar. Cuidados perineales  Mantenga la zona entre la vagina y el ano (perineo) limpia y seca, como se lo haya indicado el mdico. Utilice apsitos o aerosoles analgsicos y cremas, como se lo hayan indicado.  Si le hicieron un corte en el perineo (episiotoma) o tuvo un desgarro en la vagina, controle la  zona para detectar signos de infeccin hasta que sane. Est atenta a los siguientes signos: ? Aumento del enrojecimiento, la hinchazn o el dolor. ? Presenta lquido o sangre que supura del corte o desgarro. ? Calor. ? Pus o mal olor.  Es posible que le den una botella rociadora para que use en lugar de limpiarse el rea con papel higinico despus de usar el bao. Cuando comience a sanar, podr usar la botella rociadora antes de secarse. Asegrese de secarse suavemente.  Para aliviar el dolor causado por una episiotoma, un desgarro en la vagina o venas hinchadas en el ano (hemorroides), trate de tomar un bao de asiento tibio 2 o 3 veces por da. Un bao de asiento es un bao de agua tibia que se toma mientras se est sentado. El agua solo debe llegar hasta las caderas y cubrir las nalgas. Cuidado de las mamas  En los primeros das despus del parto, las mamas pueden sentirse pesadas, llenas e incmodas (congestin mamaria). Tambin puede escaparse leche de sus senos. El mdico puede sugerirle mtodos para aliviar este malestar. La congestin mamaria debera desaparecer al cabo de unos das.  Si est amamantando: ? Use un sostn que sujete y ajuste bien sus pechos. ? Mantenga los pezones secos y limpios. Aplquese cremas y ungentos, como se lo haya indicado el mdico. ? Es posible que deba usar discos de algodn en el sostn para absorber la leche que se filtre de sus senos. ? Puede tener contracciones uterinas cada vez que amamante durante varias semanas despus del parto. Las contracciones uterinas ayudan al tero a   regresar a su tamao habitual. ? Si tiene algn problema con la lactancia materna, colabore con el mdico o un asesor en lactancia.  Si no est amamantando: ? Evite tocarse mucho las mamas. Al hacerlo, podran producir ms leche. ? Use un sostn que le proporcione el ajuste correcto y compresas fras para reducir la hinchazn. ? No extraiga (saque) leche materna. Esto har que  produzca ms leche. Intimidad y sexualidad  Pregntele al mdico cundo puede retomar la actividad sexual. Esto puede depender de lo siguiente: ? Su riesgo de sufrir infecciones. ? La rapidez con la que est sanando. ? Su comodidad y deseo de retomar la actividad sexual.  Despus del parto, puede quedar embarazada incluso si no ha tenido todava su perodo. Si lo desea, hable con el mdico acerca de los mtodos de control de la natalidad (mtodos anticonceptivos). Medicamentos  Tome los medicamentos de venta libre y los recetados solamente como se lo haya indicado el mdico.  Si le recetaron un antibitico, tmelo como se lo haya indicado el mdico. No deje de tomar el antibitico aunque comience a sentirse mejor. Actividad  Retome sus actividades normales de a poco como se lo haya indicado el mdico. Pregntele al mdico qu actividades son seguras para usted.  Descanse todo lo que pueda. Trate de descansar o tomar una siesta mientras el beb duerme. Comida y bebida   Beba suficiente lquido como para mantener la orina de color amarillo plido.  Coma alimentos ricos en fibras todos los das. Estos pueden ayudarla a prevenir o aliviar el estreimiento. Los alimentos ricos en fibras incluyen, entre otros: ? Panes y cereales integrales. ? Arroz integral. ? Frijoles. ? Frutas y verduras frescas.  No intente perder de peso rpidamente reduciendo el consumo de caloras.  Tome sus vitaminas prenatales hasta la visita de seguimiento de posparto o hasta que su mdico le indique que puede dejar de tomarlas. Estilo de vida  No consuma ningn producto que contenga nicotina o tabaco, como cigarrillos y cigarrillos electrnicos. Si necesita ayuda para dejar de fumar, consulte al mdico.  No beba alcohol, especialmente si est amamantando. Instrucciones generales  Concurra a todas las visitas de seguimiento para usted y el beb, como se lo haya indicado el mdico. La mayora de las mujeres  visita al mdico para un seguimiento de posparto dentro de las primeras 3 a 6 semanas despus del parto. Comunquese con un mdico si:  Se siente incapaz de controlar los cambios que implica tener un hijo y esos sentimientos no desaparecen.  Siente tristeza o preocupacin de forma inusual.  Las mamas se ponen rojas, le duelen o se endurecen.  Tiene fiebre.  Tiene dificultad para retener la orina o para impedir que la orina se escape.  Tiene poco inters o falta de inters en actividades que solan gustarle.  No ha amamantado nada y no ha tenido un perodo menstrual durante 12 semanas despus del parto.  Dej de amamantar al beb y no ha tenido su perodo menstrual durante 12 semanas despus de dejar de amamantar.  Tiene preguntas sobre su cuidado y el del beb.  Elimina un cogulo de sangre grande por la vagina. Solicite ayuda de inmediato si:  Siente dolor en el pecho.  Tiene dificultad para respirar.  Tiene un dolor repentino e intenso en la pierna.  Tiene dolor intenso o clicos en el la parte inferior del abdomen.  Tiene una hemorragia tan intensa de la vagina que empapa ms de un apsito en una hora. El sangrado   no debe ser ms abundante que el perodo ms intenso que haya tenido.  Dolor de cabeza intenso.  Se desmaya.  Tiene visin borrosa o manchas en la vista.  Tiene secrecin vaginal con mal olor.  Tiene pensamientos acerca de lastimarse a usted misma o a su beb. Si alguna vez siente que puede lastimarse a usted misma o a otras personas, o tiene pensamientos de poner fin a su vida, busque ayuda de inmediato. Puede dirigirse al departamento de emergencias ms cercano o llamar a:  El servicio de emergencias de su localidad (911 en [place]  Una lnea de asistencia al suicida y atencin en crisis, como la Lnea Nacional de Prevencin del Suicidio (National Suicide Prevention Lifeline), al 1-800-273-8255. Est disponible las 24 horas del da. Resumen  El  perodo de tiempo justo despus el parto y hasta 6 a 12 semanas despus del parto se denomina perodo posparto.  Retome sus actividades normales de a poco como se lo haya indicado el mdico.  Concurra a todas las visitas de seguimiento para usted y el beb, como se lo haya indicado el mdico. Esta informacin no tiene como fin reemplazar el consejo del mdico. Asegrese de hacerle al mdico cualquier pregunta que tenga. Document Revised: 03/11/2018 Document Reviewed: 11/20/2017 Elsevier Patient Education  2020 Elsevier Inc.  

## 2020-12-19 NOTE — Progress Notes (Signed)
Post Partum Visit Note  [name] is a 40 y.o. [phone] female who presents for a postpartum visit. She is 3 weeks postpartum following a normal spontaneous vaginal delivery.  I have fully reviewed the prenatal and intrapartum course. The delivery was at 37/3 gestational weeks.  Anesthesia: epidural. Postpartum course has been unremarkable. Baby is doing well. Baby is feeding by breast. Bleeding staining only. Bowel function is normal. Bladder function is normal. Patient is not sexually active. Contraception method is none. Postpartum depression screening: negative.   The pregnancy intention screening data noted above was reviewed. Potential methods of contraception were discussed. The patient elected to proceed with Oral Contraceptive.    Edinburgh Postnatal Depression Scale - 12/19/20 1059      Edinburgh Postnatal Depression Scale:  In the Past 7 Days   I have been able to laugh and see the funny side of things. 0    I have looked forward with enjoyment to things. 0    I have blamed myself unnecessarily when things went wrong. 0    I have been anxious or worried for no good reason. 0    I have felt scared or panicky for no good reason. 0    Things have been getting on top of me. 0    I have been so unhappy that I have had difficulty sleeping. 0    I have felt sad or miserable. 0    I have been so unhappy that I have been crying. 0    The thought of harming myself has occurred to me. 0    Edinburgh Postnatal Depression Scale Total 0         Due for a pap   The following portions of the patient's history were reviewed and updated as appropriate: allergies, current medications, past family history, past medical history, past social history, past surgical history and problem list.  Review of Systems Pertinent items noted in HPI and remainder of comprehensive ROS otherwise negative.    Objective:  BP (!) 143/95 (BP Location: Left Arm)   Pulse 68   Wt 155 lb 4.8 oz  (70.4 kg)   LMP 02/15/2020 (Approximate)   Breastfeeding Yes   BMI 31.37 kg/m    General:  alert, cooperative and appears stated age   Breasts:  deferred  Lungs: comfortable on room air   Vulva:  normal, 2nd degree laceration well healed  Vagina: not evaluated        Assessment:    Normal postpartum exam. Pap smear not done at today's visit.   Plan:   Essential components of care per ACOG recommendations:  1.  Mood and well being: Patient with negative depression screening today. Reviewed local resources for support.  - Patient does not use tobacco.  - hx of drug use? No    2. Infant care and feeding:  -Patient currently breastmilk feeding? Yes If breastmilk feeding discussed return to work and pumping. If needed, patient was provided letter for work to allow for every 2-3 hr pumping breaks, and to be granted a private location to express breastmilk and refrigerated area to store breastmilk. Reviewed importance of draining breast regularly to support lactation. -Social determinants of health (SDOH) reviewed in EPIC. No concerns  3. Sexuality, contraception and birth spacing - Patient does want a pregnancy in the next year.  Desired family size is one more child.  - Reviewed forms of contraception in tiered fashion. Patient desired oral progesterone-only contraceptive today.   -  Discussed birth spacing of 18 months - Counseled at length on importance of taking POPs at same time every day for maximum effect  4. Sleep and fatigue -Encouraged family/partner/community support of 4 hrs of uninterrupted sleep to help with mood and fatigue  5. Physical Recovery  - Discussed patients delivery and complications - Patient had a 2nd degree laceration, perineal healing reviewed. Patient expressed understanding - Patient has urinary incontinence? No - Patient is safe to resume physical and sexual activity  6.  Health Maintenance - Last pap smear done 06/27/2020 and was normal with  negative HPV. - Mammogram: not indicated  7. Chronic Disease - Remains HTN at todays visit, not compliant with medications as a [facility] nurse checked her BP recently and it was normal so she stopped taking them to see what would happen. Given mild HTN reduced dose to 60mg  daily, will see back in a month and see if we can stop medicine at that time.   Return in about 4 weeks (around 01/16/2021) for BP check, nurse visit.   03/16/2021, MD Center for [facility], [facility]

## 2021-01-05 ENCOUNTER — Encounter: Payer: Self-pay | Admitting: *Deleted

## 2021-01-05 NOTE — Progress Notes (Signed)
Received fax with confirmation of COVID vaccination. Immunization record updated. Linda,RN

## 2021-01-16 ENCOUNTER — Ambulatory Visit: Payer: Self-pay

## 2021-01-26 ENCOUNTER — Ambulatory Visit: Payer: Self-pay

## 2021-01-26 NOTE — Progress Notes (Deleted)
Called pt and pt needs to reschedule appt. Pt agreeable to come for Nurse visit today at 3:30pm. Pt states does not have batteries for BP cuff at home.  Will send message to front office to reschedule pt.  Pt agreeable and verbalized understanding of date and time of appt.  Judeth Cornfield, RN BSN

## 2022-06-04 IMAGING — US US MFM OB DETAIL+14 WK
1 series · 13 of 28 positions shown · non-contrast
Comparison: none

[Series 1: us mfm ob detail+14 wk · 13 of 99 slices shown]
[im 4/99]
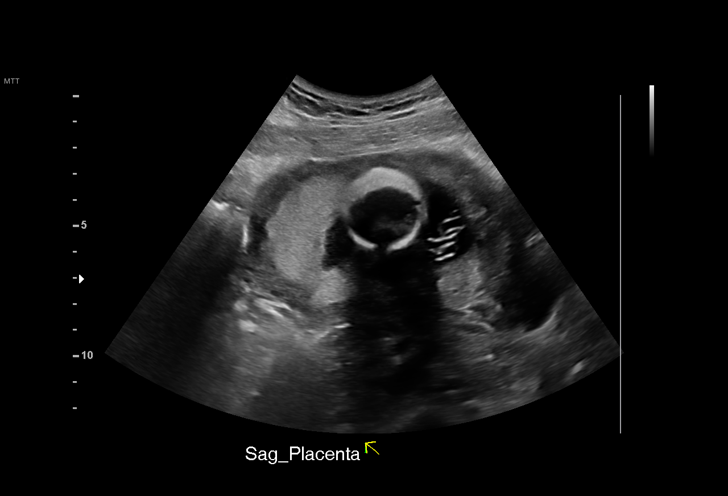
[im 11/99]
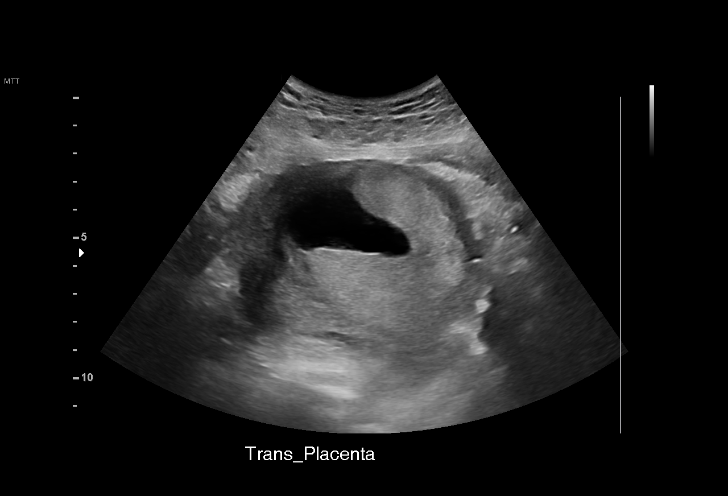
[im 19/99]
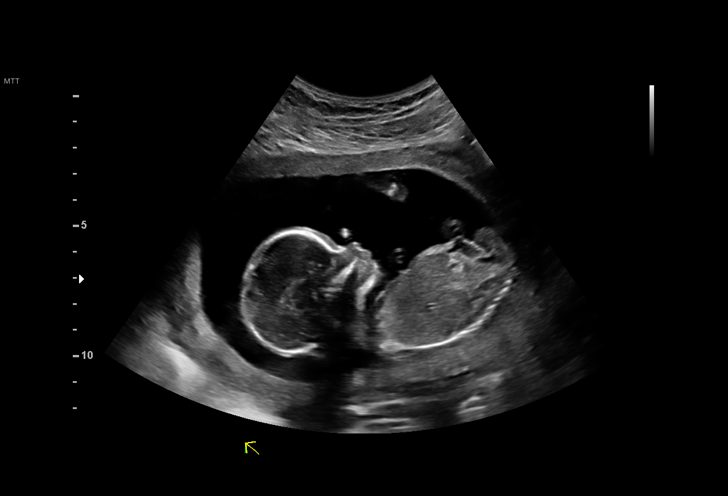
[im 26/99]
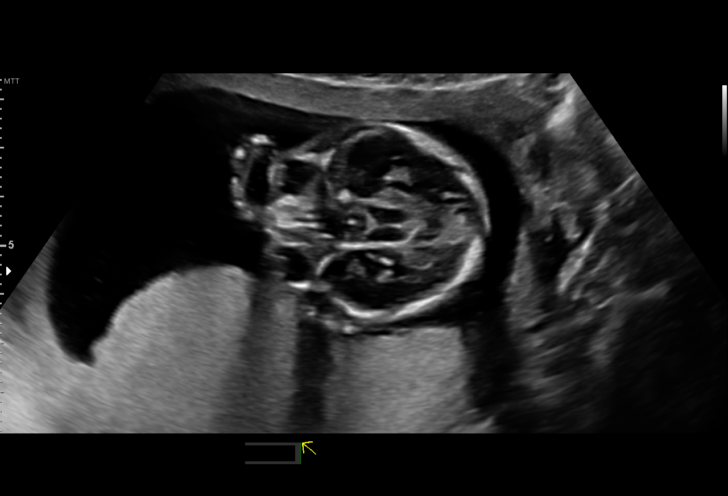
[im 33/99]
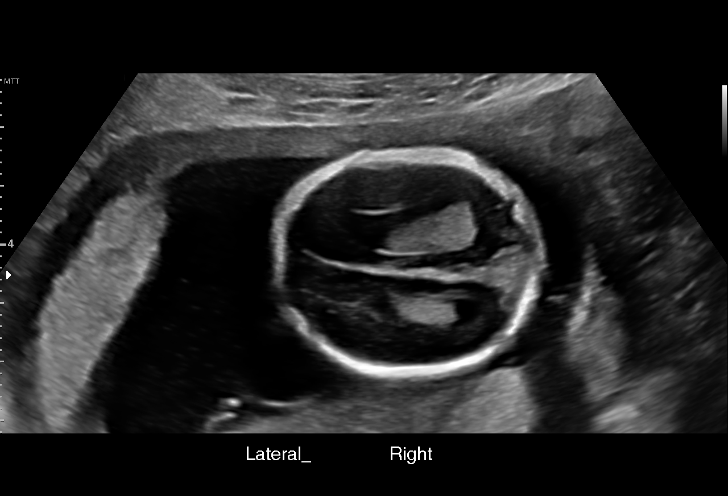
[im 40/99]
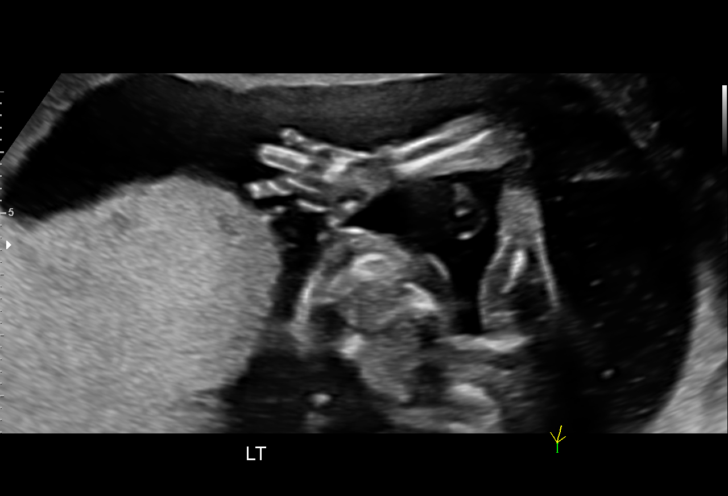
[im 51/99]
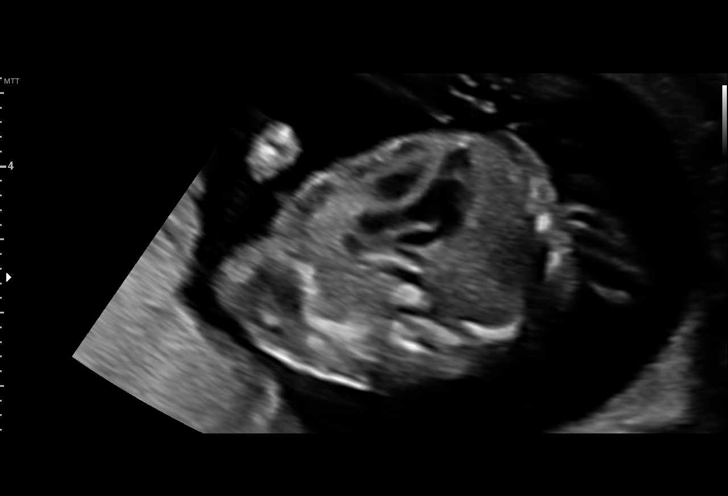
[im 59/99]
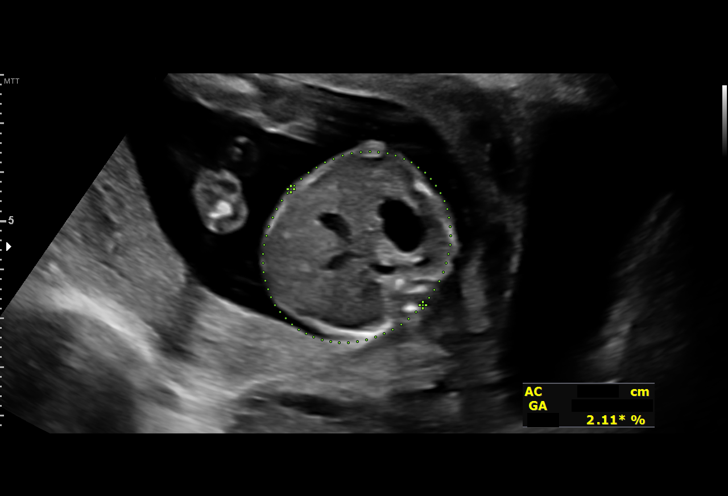
[im 66/99]
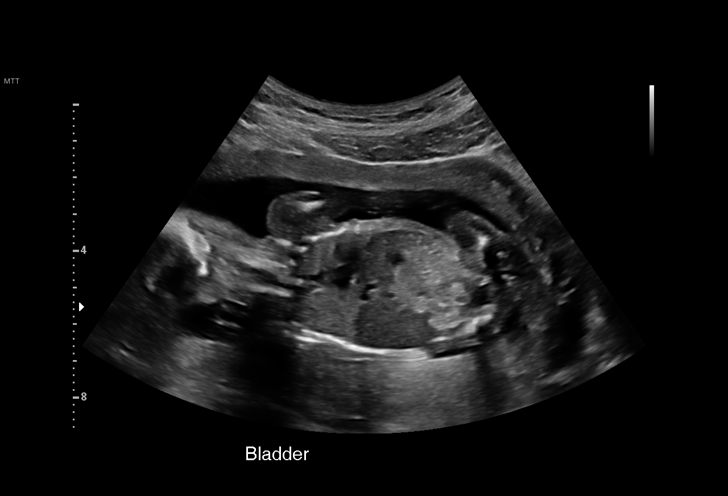
[im 73/99]
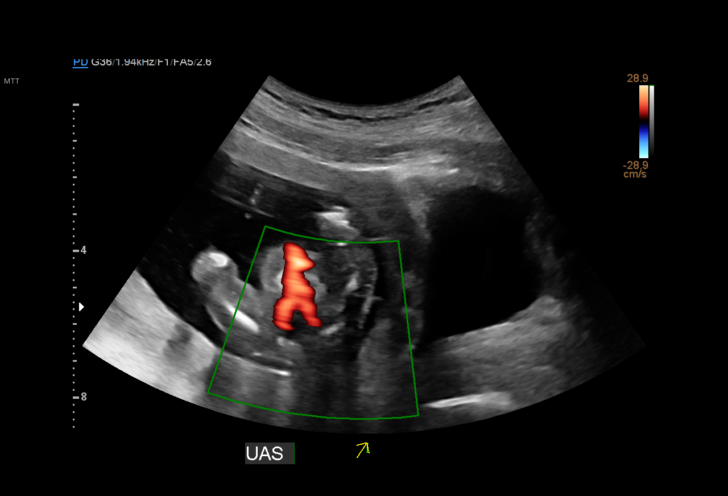
[im 80/99]
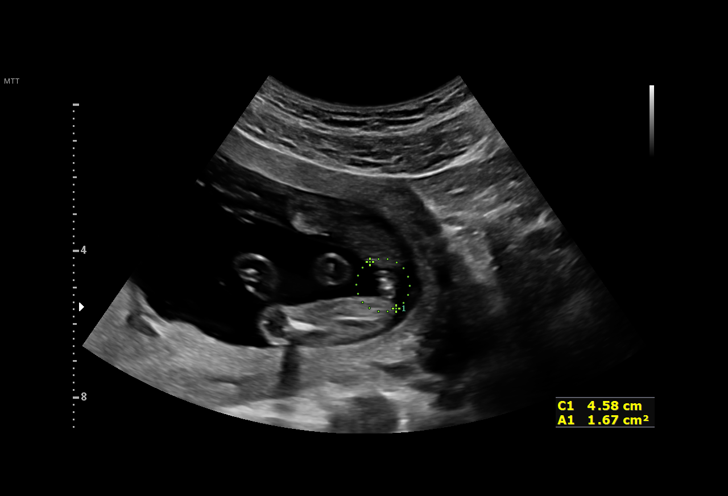
[im 88/99]
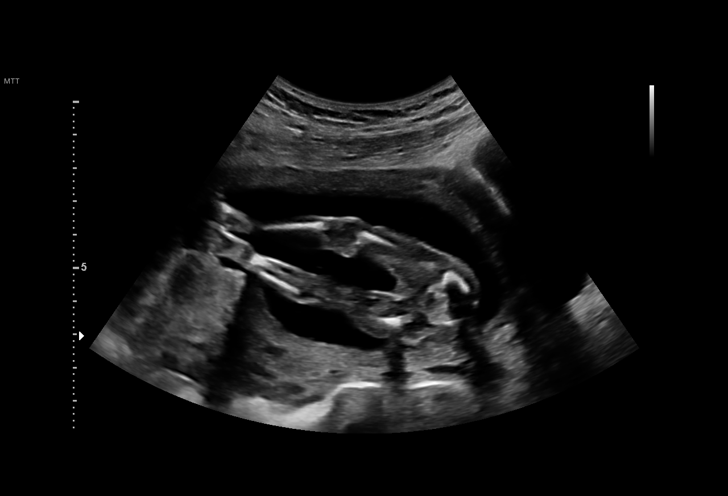
[im 95/99]
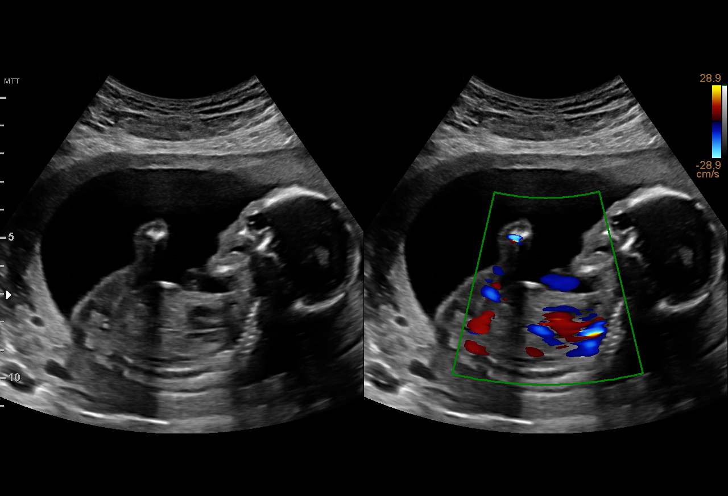

[13 of 28 positions shown; findings below may reference images not displayed]

[REDACTED]

Indications

 17 weeks gestation of pregnancy
 Encounter for antenatal screening for
 malformations
 Hypertension - Chronic/Pre-existing
 Advanced maternal age multigravida 35+,
 second trimester
 Poor obstetric history: Previous preterm
 delivery, antepartum x 2
Fetal Evaluation

 Num Of Fetuses:         1
 Fetal Heart Rate(bpm):  138
 Cardiac Activity:       Observed
 Presentation:           Breech
 Placenta:               Posterior
 P. Cord Insertion:      Visualized, central

 Amniotic Fluid
 AFI FV:      Within normal limits

                             Largest Pocket(cm)

Biometry
 BPD:      38.6  mm     G. Age:  17w 5d         71  %    CI:        76.42   %    70 - 86
                                                         FL/HC:      15.2   %    14.6 -
 HC:      139.9  mm     G. Age:  17w 2d         43  %    HC/AC:      1.15        1.07 -
 AC:      121.9  mm     G. Age:  17w 6d         68  %    FL/BPD:     55.2   %
 FL:       21.3  mm     G. Age:  16w 3d         14  %    FL/AC:      17.5   %    20 - 24
 HUM:      21.6  mm     G. Age:  16w 4d         36  %
 CER:      17.7  mm     G. Age:  17w 5d         64  %
 NFT:       3.1  mm

 LV:        5.2  mm
 CM:        3.7  mm

 Est. FW:     184  gm      0 lb 6 oz     37  %
OB History

 Blood Type:   O+
 Gravidity:    4         Term:   1        Prem:   2        SAB:   0
 TOP:          0       Ectopic:  0        Living: 2
Gestational Age

 LMP:           20w 0d        Date:  02/15/20                 EDD:   11/21/20
 U/S Today:     17w 2d                                        EDD:   12/10/20
 Best:          17w 2d     Det. By:  U/S (07/04/20)           EDD:   12/10/20
Anatomy

 Cranium:               Appears normal         Aortic Arch:            Appears normal
 Cavum:                 Appears normal         Ductal Arch:            Appears normal
 Ventricles:            Appears normal         Diaphragm:              Appears normal
 Choroid Plexus:        Appears normal         Stomach:                Appears normal, left
                                                                       sided
 Cerebellum:            Appears normal         Abdomen:                Appears normal
 Posterior Fossa:       Appears normal         Abdominal Wall:         Appears nml (cord
                                                                       insert, abd wall)
 Nuchal Fold:           Appears normal         Cord Vessels:           Appears normal (3
                                                                       vessel cord)
 Face:                  Appears normal         Kidneys:                Appear normal
                        (orbits and profile)
 Lips:                  Appears normal         Bladder:                Appears normal
 Thoracic:              Appears normal         Spine:                  Not well visualized
 Heart:                 Appears normal         Upper Extremities:      Appears normal
                        (4CH, axis, and
                        situs)
 RVOT:                  Appears normal         Lower Extremities:      Appears normal
 LVOT:                  Appears normal

 Other:  Fetus appears to be female. Open hands visualized. Heels and 5th
         digit visualized. Nasal bone visualized.
Cervix Uterus Adnexa

 Cervix
 Length:           4.32  cm.
 Closed
 Uterus
 No abnormality visualized.

 Right Ovary
 Within normal limits.

 Left Ovary
 Not visualized. No adnexal mass visualized.

 Cul De Sac
 No free fluid seen.

 Adnexa
 No abnormality visualized.
Impression

 Single intrauterine pregnancy here for a detailed anatomy
 due to advanced maternal age and chronic hypertension on
 low dose ASA
 Normal anatomy with measurements consistent with dates
 There is good fetal movement and amniotic fluid volume
 Suboptimal views of the fetal anatomy were obtained
 secondary to fetal position.

 In addition we reiterated taking low dose ASA for the
 prevention of preeclampsia.
Recommendations

 Follow up anatomy is scheduled in 4 weeks
 Continue serial growth every 4 weeks.

## 2022-10-06 IMAGING — US US FETAL BPP W/ NON-STRESS
1 series · 13 of 13 positions shown · non-contrast
Comparison: none

[Series 1: us fetal bpp w/ non-stress · 13 acquisitions, 13 frames shown]
[im 1/13]
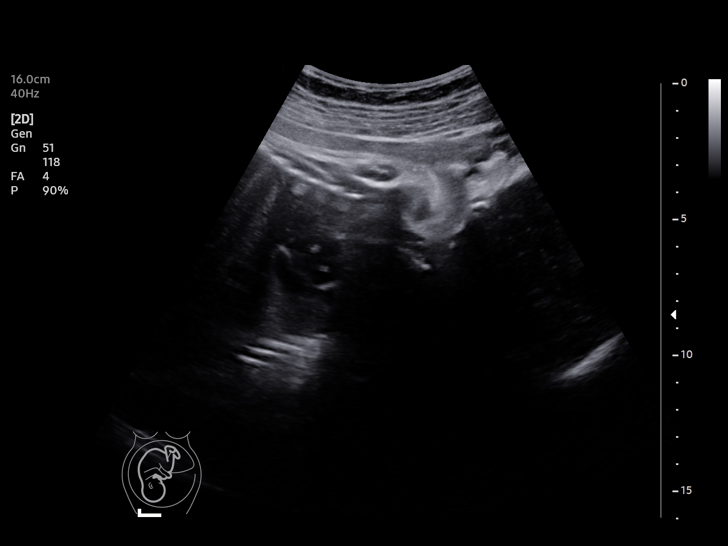
[im 2/13]
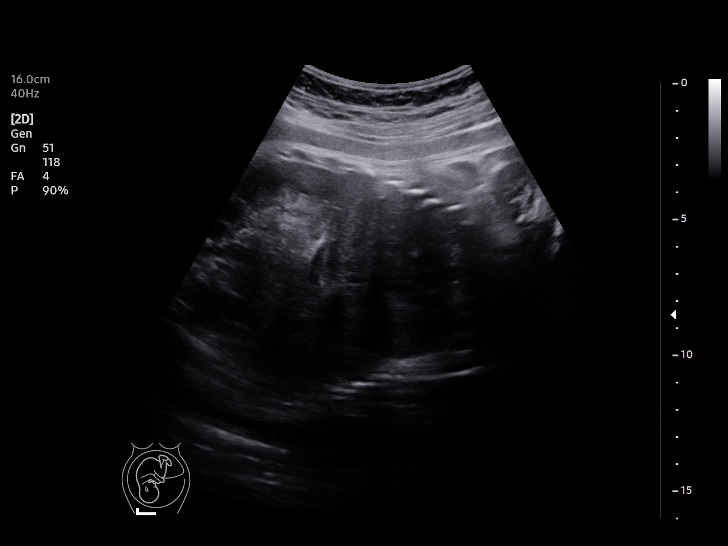
[im 3/13]
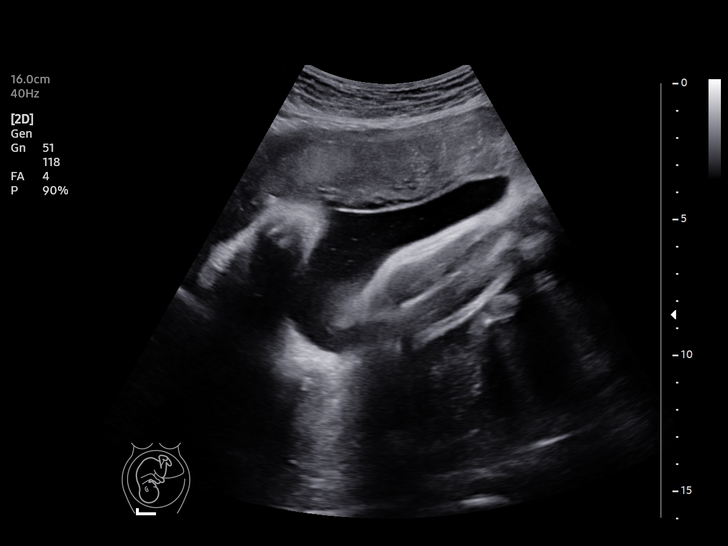
[im 4/13]
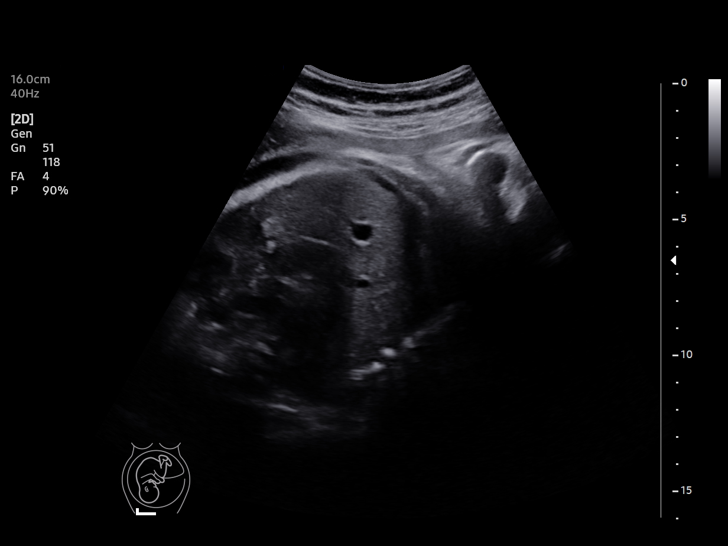
[im 5/13]
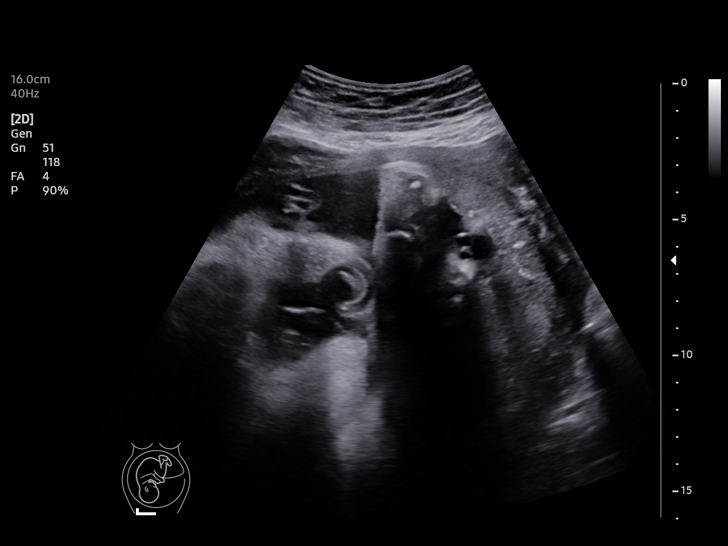
[im 6/13]
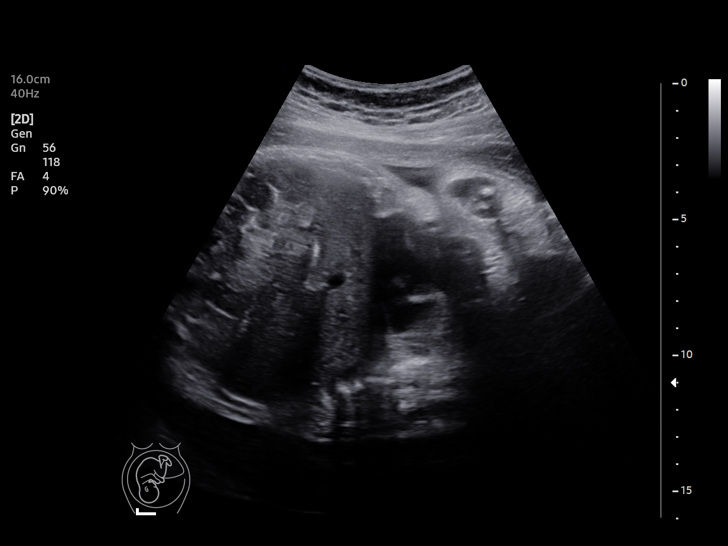
[im 7/13]
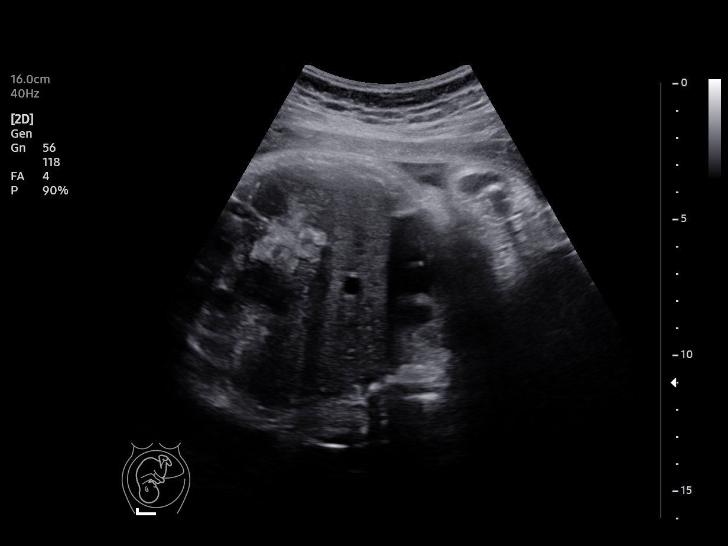
[im 8/13]
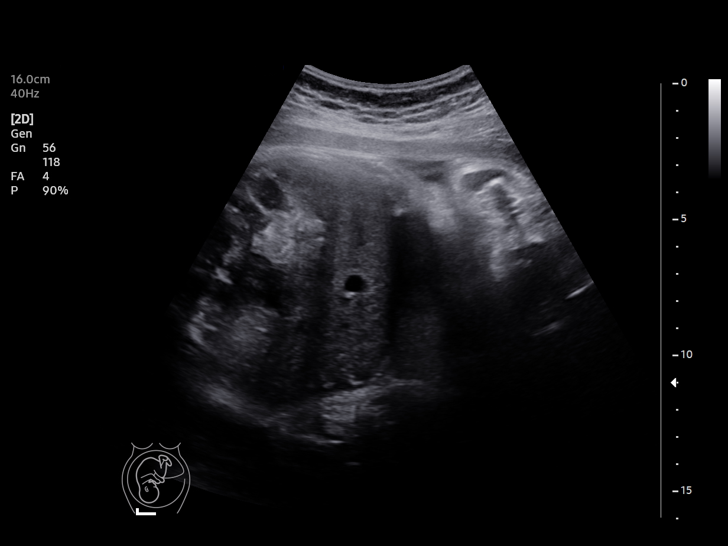
[im 9/13]
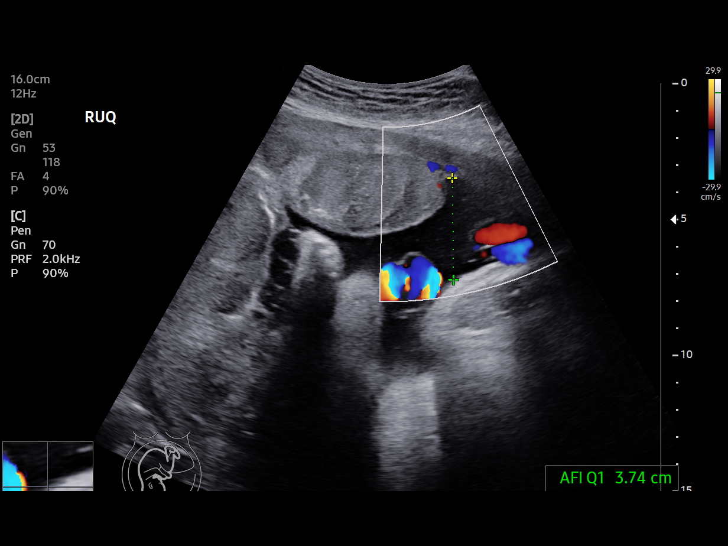
[im 10/13]
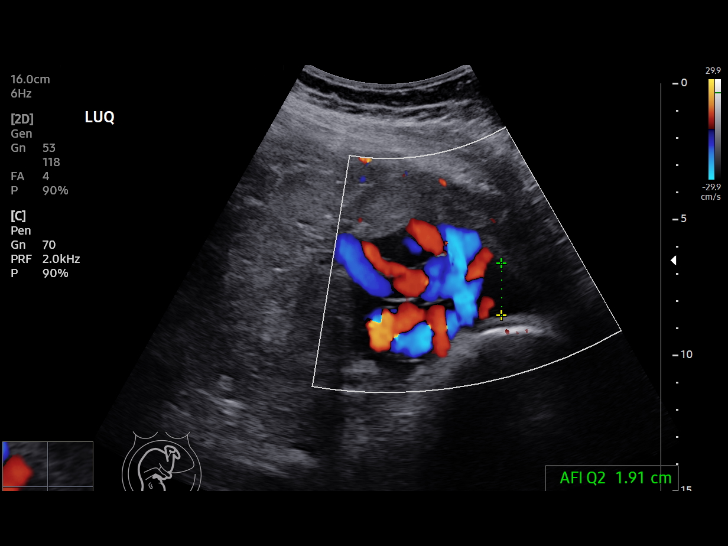
[im 11/13]
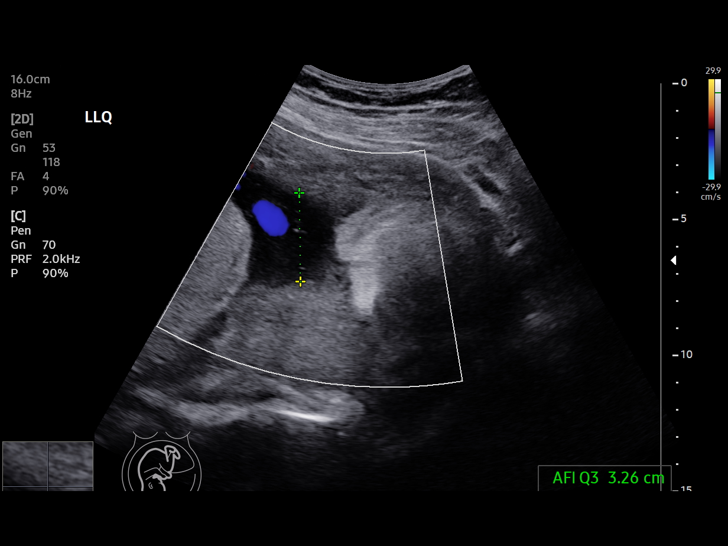
[im 12/13]
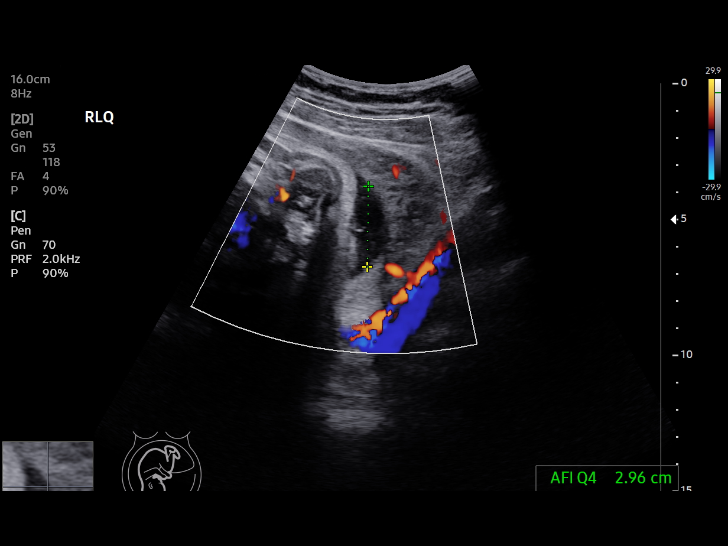
[im 13/13]
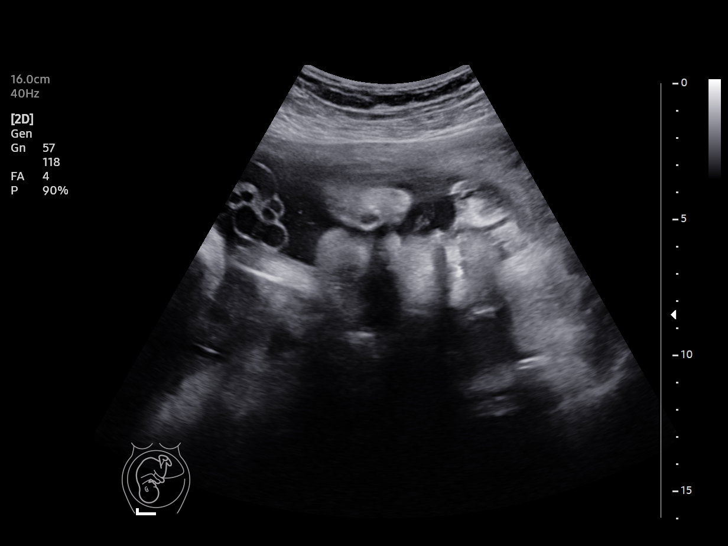

[13 of 13 positions shown; findings below may reference images not displayed]

Name:       BRONWEN VITE                         Visit Date: 11/05/2020 [DATE]
             [REDACTED]
                                                            Healthcare at

 1  US FETAL BPP W/NONSTRESS              768[address].4     [place]

Service(s) Provided

Indications

 35 weeks gestation of pregnancy
 Hypertension - Chronic/Pre-existing
Fetal Evaluation

 Num Of Fetuses:         1
 Preg. Location:         Intrauterine
 Cardiac Activity:       Observed
 Presentation:           Cephalic

 Amniotic Fluid
 AFI FV:      Within normal limits

 AFI Sum(cm)     %Tile       Largest Pocket(cm)
 11.9            35

 RUQ(cm)       RLQ(cm)       LUQ(cm)        LLQ(cm)
 3.7           3
Biophysical Evaluation
 Amniotic F.V:   Pocket => 2 cm             F. Tone:        Observed
 F. Movement:    Observed                   N.S.T:          Reactive
 F. Breathing:   Observed                   Score:          [DATE]
OB History

 Blood Type:   O+
 Gravidity:    4         Term:   1        Prem:   2        SAB:   0
 TOP:          0       Ectopic:  0        Living: 2
Gestational Age

 LMP:           37w 5d        Date:  02/15/20                 EDD:   11/21/20
 Best:          35w 0d     Det. By:  U/S  (07/04/20)          EDD:   12/10/20
Impression

 Reassuring antenatal testing
Recommendations

 Continue with weekly testing.
                INGUNN HARPA RONLOR

## 2022-10-14 IMAGING — US US FETAL BPP W/ NON-STRESS
1 series · 13 of 14 positions shown · non-contrast
Comparison: none

[Series 1: us fetal bpp w/ non-stress · 14 acquisitions, 13 frames shown]
[im 1/14]
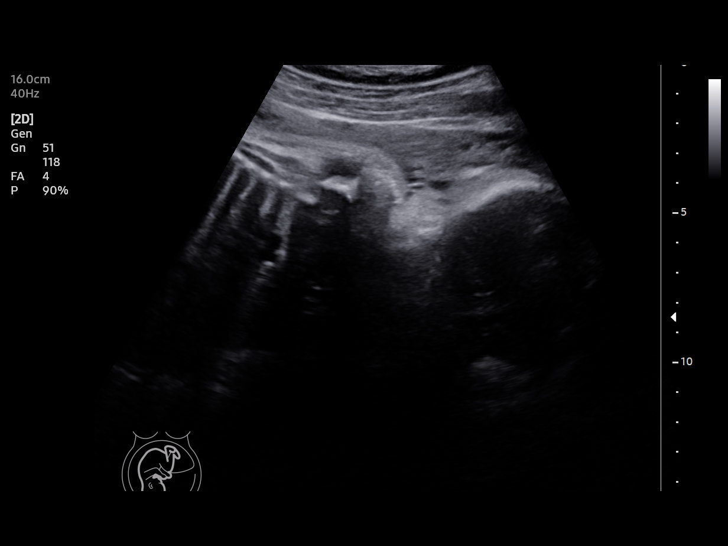
[im 2/14]
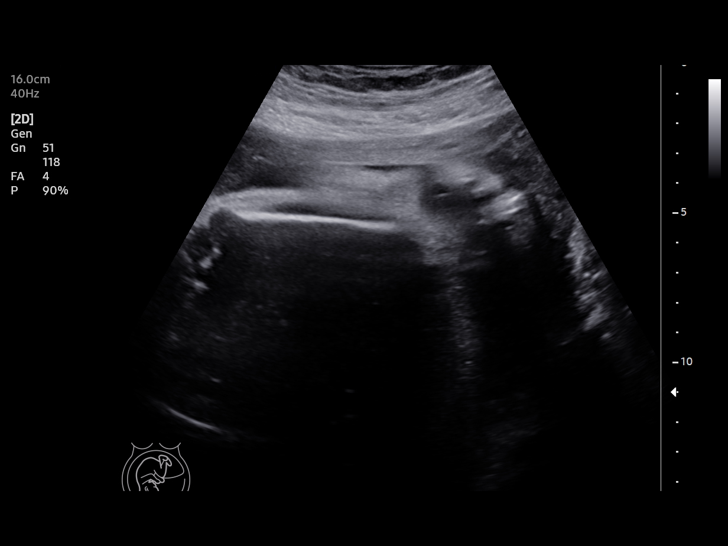
[im 3/14]
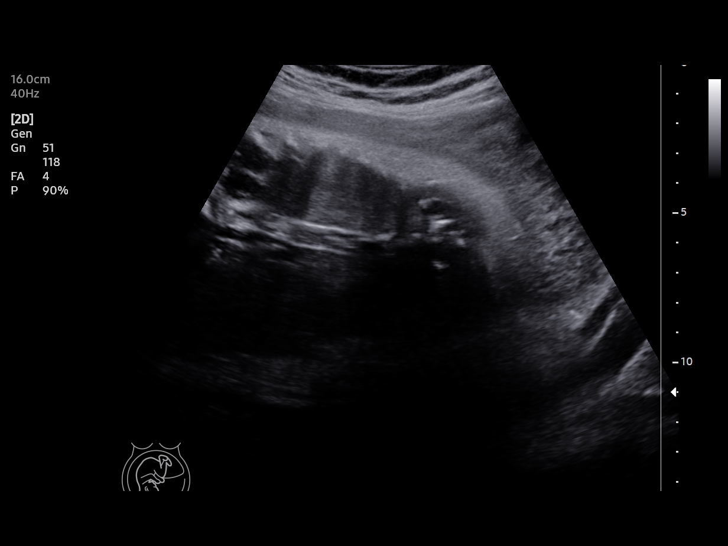
[im 4/14]
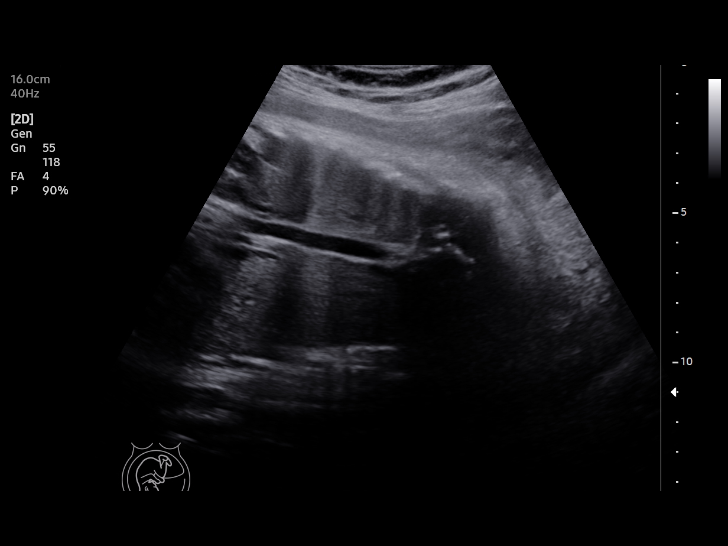
[im 5/14]
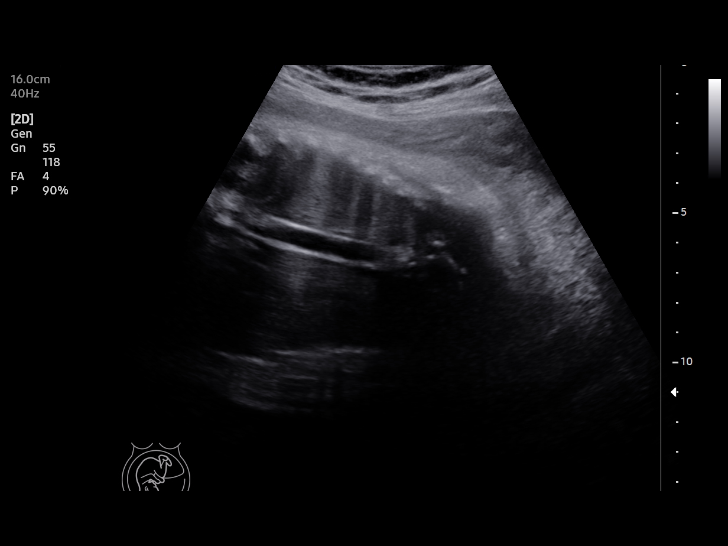
[im 6/14]
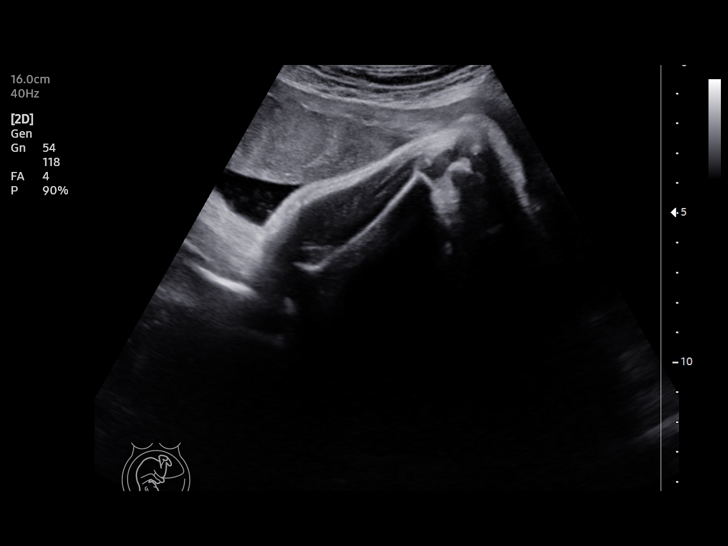
[im 8/14]
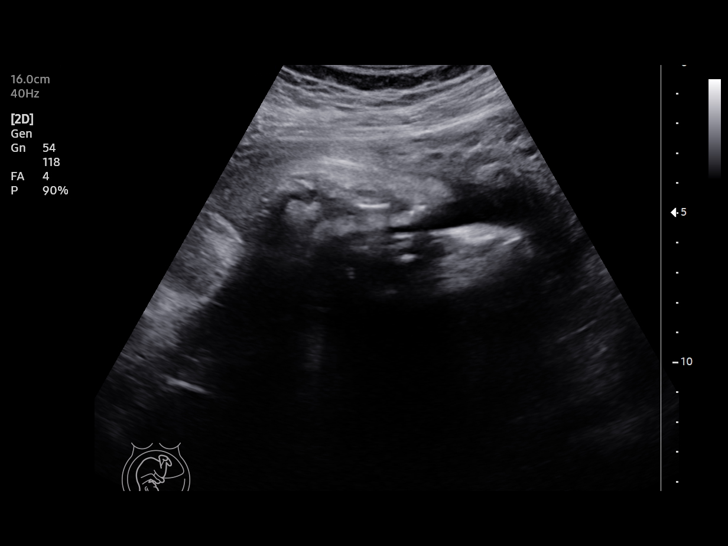
[im 9/14]
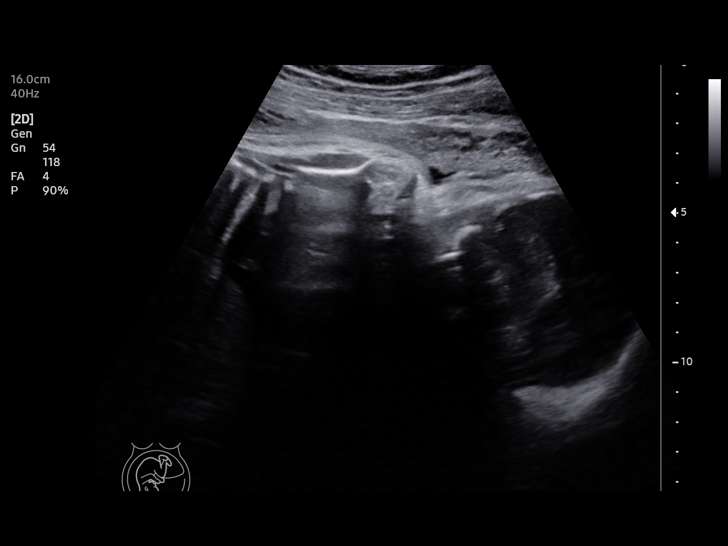
[im 10/14]
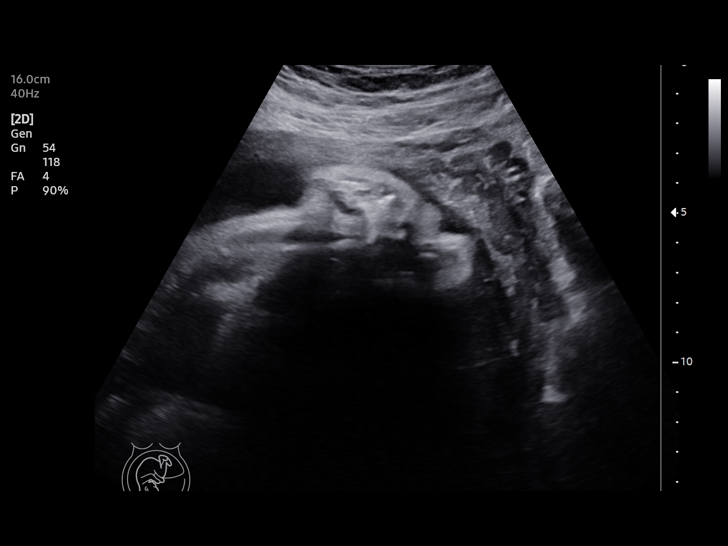
[im 11/14]
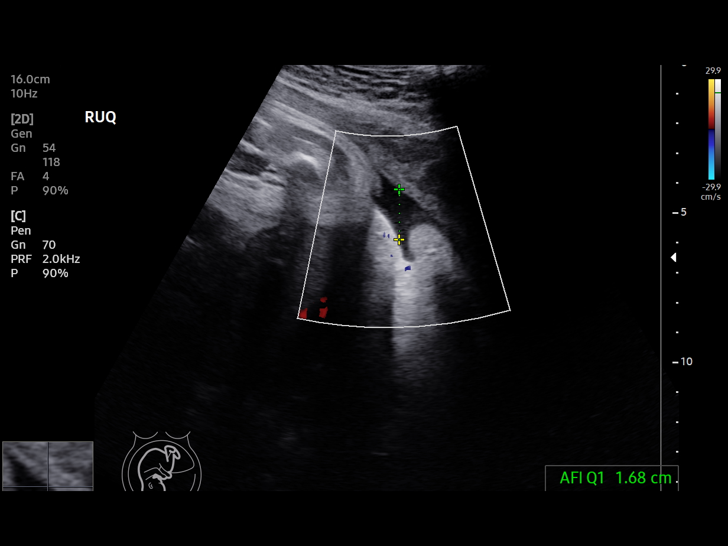
[im 12/14]
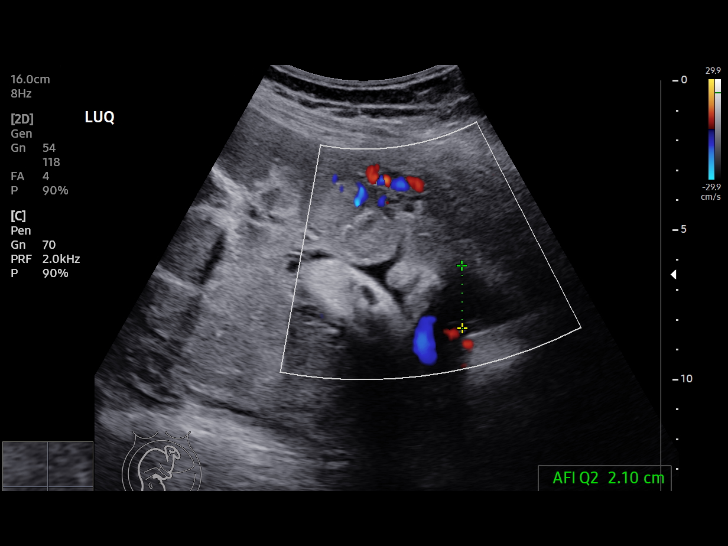
[im 13/14]
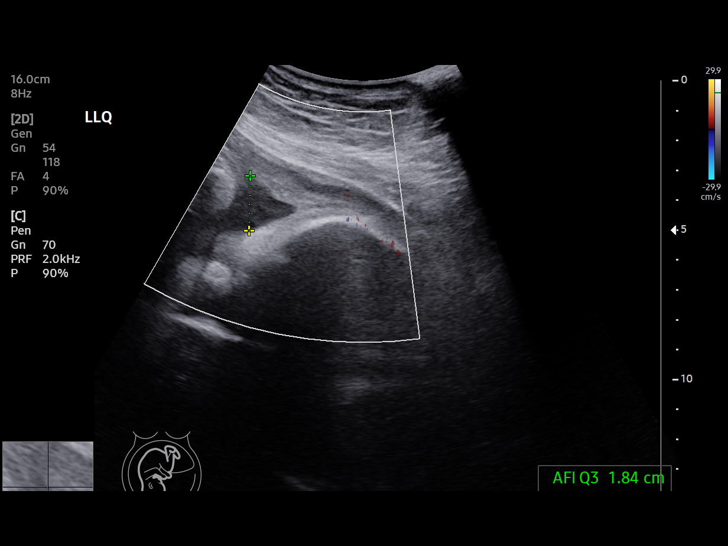
[im 14/14]
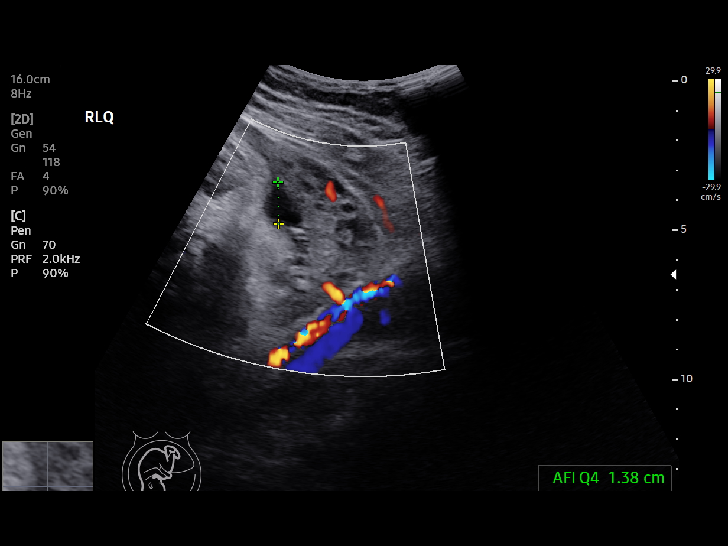

[13 of 14 positions shown; findings below may reference images not displayed]

Name:       GAITRI MYLES                         Visit Date: 11/13/2020 [DATE]
             [REDACTED]
                                                            Healthcare at

 1  US FETAL BPP W/NONSTRESS              76818.4     AVRIL

Service(s) Provided

Indications

 36 weeks gestation of pregnancy
 Hypertension - Chronic/Pre-existing
Fetal Evaluation

 Num Of Fetuses:         1
 Preg. Location:         Intrauterine
 Cardiac Activity:       Observed
 Presentation:           Cephalic

 Amniotic Fluid
 AFI FV:      Subjectively low-normal

 AFI Sum(cm)     %Tile       Largest Pocket(cm)
 7

 RUQ(cm)       RLQ(cm)       LUQ(cm)        LLQ(cm)

Biophysical Evaluation
 Amniotic F.V:   Pocket => 2 cm             F. Tone:        Observed
 F. Movement:    Observed                   N.S.T:          Reactive
 F. Breathing:   Observed                   Score:          [DATE]
OB History

 Blood Type:   O+
 Gravidity:    4         Term:   1        Prem:   2        SAB:   0
 TOP:          0       Ectopic:  0        Living: 2
Gestational Age

 LMP:           38w 6d        Date:  02/15/20                 EDD:   11/21/20
 Best:          36w 1d     Det. By:  U/S  (07/04/20)          EDD:   12/10/20
Impression

 NST also is reactive, BPP [DATE] which is reassuring. Low
 normal amniotic fluid volume.
Recommendations

 Continue weekly antenatal testing till delivery .
              KOFI BOATENG VIM

## 2023-04-07 ENCOUNTER — Telehealth: Payer: Self-pay

## 2023-04-07 NOTE — Telephone Encounter (Signed)
Telephoned patient using interpreter#[phone].Voice mail not an option, [facility]CP unable to leave a message.

## 2023-05-12 ENCOUNTER — Other Ambulatory Visit: Payer: Self-pay | Admitting: Obstetrics and Gynecology

## 2023-05-12 DIAGNOSIS — Z1231 Encounter for screening mammogram for malignant neoplasm of breast: Secondary | ICD-10-CM

## 2023-06-02 ENCOUNTER — Other Ambulatory Visit: Payer: Self-pay

## 2023-06-02 ENCOUNTER — Emergency Department (HOSPITAL_COMMUNITY)
Admission: EM | Admit: 2023-06-02 | Discharge: 2023-06-02 | Disposition: A | Discharge status: Home / Self Care | Payer: Self-pay | Attending: Emergency Medicine | Admitting: Emergency Medicine

## 2023-06-02 ENCOUNTER — Encounter (HOSPITAL_COMMUNITY): Payer: Self-pay

## 2023-06-02 ENCOUNTER — Emergency Department (HOSPITAL_COMMUNITY): Payer: Self-pay

## 2023-06-02 DIAGNOSIS — M7989 Other specified soft tissue disorders: Secondary | ICD-10-CM

## 2023-06-02 DIAGNOSIS — Z91040 Latex allergy status: Secondary | ICD-10-CM | POA: Insufficient documentation

## 2023-06-02 DIAGNOSIS — M79601 Pain in right arm: Secondary | ICD-10-CM | POA: Insufficient documentation

## 2023-06-02 MED ORDER — CEPHALEXIN 500 MG PO CAPS: 500.0000 mg | ORAL_CAPSULE | Freq: Four times a day (QID) | ORAL | 0 refills | Status: DC

## 2023-06-02 MED ORDER — CEPHALEXIN 250 MG PO CAPS
1000.0000 mg | ORAL_CAPSULE | Freq: Once | ORAL | Status: AC
  Administered 2023-06-02: 1000 mg via ORAL
  Filled 2023-06-02: qty 4

## 2023-06-02 NOTE — ED Triage Notes (Signed)
Pt came in via POV d/t Rt FA swelling since 2 days ago. She reports starting Prednisolone she was given for Poison Ivy that she got last week from her garden & is concerned the arm swelling may be a possible side effect. A/Ox4, rates pain 5/10 if not moving it & if she uses her Rt arm it spikes to a 8/10. Denies any recent falls or injuries to the Rt arm.

## 2023-06-03 ENCOUNTER — Ambulatory Visit (HOSPITAL_COMMUNITY): Payer: Self-pay

## 2023-06-03 ENCOUNTER — Ambulatory Visit (HOSPITAL_COMMUNITY)
Admission: RE | Admit: 2023-06-03 | Discharge: 2023-06-03 | Disposition: A | Discharge status: Home / Self Care | Payer: Self-pay | Source: Ambulatory Visit | Attending: Emergency Medicine | Admitting: Emergency Medicine

## 2023-06-03 DIAGNOSIS — M7989 Other specified soft tissue disorders: Secondary | ICD-10-CM | POA: Insufficient documentation

## 2023-06-03 DIAGNOSIS — L538 Other specified erythematous conditions: Secondary | ICD-10-CM

## 2023-06-03 NOTE — ED Provider Notes (Signed)
[place] EMERGENCY DEPARTMENT AT [facility] Provider Note   CSN: [id number] Arrival date & time: 06/02/23  1706     History  Chief Complaint  Patient presents with   Rt Arm Pain    [name] is a 42 y.o. female.  42 year old female who presents the ER today secondary to swelling and pain in his right arm.  She states that she had poison ivy last week yesterday and prednisone initially on her lower extremities.  She is worried that she is having a reaction to it that she has had about 24 to 36 hours of progressively worsening erythema, induration and tenderness to palpation on her right arm.  No fevers.  No rapid spreading.  No other associated symptoms.        Home Medications Prior to Admission medications   Medication Sig Start Date End Date Taking? Authorizing Provider  cephALEXin (KEFLEX) 500 MG capsule Take 1 capsule (500 mg total) by mouth 4 (four) times daily. 06/02/23  Yes Jewelz Ricklefs, Barbara Cower, MD  NIFEdipine (ADALAT CC) 30 MG 24 hr tablet TAKE 2 TABLETS (60MG ) IN THE MORNING AND 1 TABLET (30MG ) IN THE EVENING 11/24/20 11/24/21  Warden Fillers, MD  NIFEdipine (PROCARDIA-XL/NIFEDICAL-XL) 30 MG 24 hr tablet Take 2 tablets (60 mg total) by mouth daily. Take 2 tablets (60mg ) in the morning and 1 tablet (30mg ) in the evening 12/19/20 01/18/21  Venora Maples, MD  norethindrone (MICRONOR) 0.35 MG tablet Take 1 tablet (0.35 mg total) by mouth daily. 12/19/20   Venora Maples, MD  Prenatal Vit w/Fe-Methylfol-FA (PNV PO) Take by mouth. Patient not taking: No sig reported    [provider]      Allergies    Latex and Tape    Review of Systems   Review of Systems  Physical Exam Updated Vital Signs BP 127/83   Pulse 78   Temp 98.1 F (36.7 C) (Oral)   Resp 16   Ht 4\' 10"  (1.473 m)   Wt 62.1 kg   SpO2 100%   BMI 28.63 kg/m  Physical Exam Vitals and nursing note reviewed.  Constitutional:      Appearance: She is well-developed.  HENT:      Head: Normocephalic and atraumatic.  Cardiovascular:     Rate and Rhythm: Normal rate and regular rhythm.  Pulmonary:     Effort: No respiratory distress.     Breath sounds: No stridor.  Abdominal:     General: There is no distension.  Musculoskeletal:     Cervical back: Normal range of motion.  Skin:    Comments: Bilateral lower extremities with various stages of healing of current contact dermatitis  Right upper extremity dorsal surface just proximal to the wrist shows a moderate area of erythema, tenderness and induration.  Mildly warm.  Not circumferential.  Neurovascular intact.  Neurological:     Mental Status: She is alert.     ED Results / Procedures / Treatments   Labs (all labs ordered are listed, but only abnormal results are displayed) Labs Reviewed - No data to display  EKG None  Radiology DG Forearm Right  Result Date: 06/02/2023 CLINICAL DATA:  Right forearm pain. EXAM: RIGHT FOREARM - 2 VIEW COMPARISON:  None Available. FINDINGS: There is no evidence of fracture or other focal bone lesions. Soft tissues are unremarkable. IMPRESSION: Negative. Electronically Signed   By: Lupita Raider M.D.   On: 06/02/2023 18:26    Procedures Procedures  Medications Ordered in ED Medications  cephALEXin (KEFLEX) capsule 1,000 mg (1,000 mg Oral Given 06/02/23 2318)    ED Course/ Medical Decision Making/ A&P                             Medical Decision Making Amount and/or Complexity of Data Reviewed Radiology: ordered.  Risk Prescription drug management.   Does not appear consistent with contact dermatitis.  Concern for possible cellulitis will start antibiotics.  Also consider DVT so will come back tomorrow to get a ultrasound done I think this is less likely with the appearance that she has. Follow-up with PCP in a few days if not improving.  Final Clinical Impression(s) / ED Diagnoses Final diagnoses:  Arm swelling    Rx / DC Orders ED Discharge  Orders          Ordered    cephALEXin (KEFLEX) 500 MG capsule  4 times daily        06/02/23 2310    VAS [place] UPPER EXTREMITY VENOUS DUPLEX        06/02/23 2311              Hendryx Ricke, Barbara Cower, MD 06/03/23 [phone]

## 2023-06-03 NOTE — Progress Notes (Signed)
Right upper extremity venous duplex has been completed. Preliminary results can be found in CV Proc through chart review.   06/03/23 3:25 PM Olen Cordial RVT

## 2023-07-14 ENCOUNTER — Ambulatory Visit
Admission: RE | Admit: 2023-07-14 | Discharge: 2023-07-14 | Disposition: A | Discharge status: Home / Self Care | Payer: No Typology Code available for payment source | Source: Ambulatory Visit | Attending: Obstetrics and Gynecology | Admitting: Obstetrics and Gynecology

## 2023-07-14 ENCOUNTER — Ambulatory Visit: Payer: Self-pay | Admitting: Hematology and Oncology

## 2023-07-14 VITALS — BP 125/88 | Wt 140.0 lb

## 2023-07-14 DIAGNOSIS — Z1231 Encounter for screening mammogram for malignant neoplasm of breast: Secondary | ICD-10-CM

## 2023-07-14 NOTE — Progress Notes (Signed)
[name] is a 42 y.o. female who presents to [facility] clinic today with no complaints.    Pap Smear: Pap not smear completed today. Last Pap smear was June 2024 at [facility] clinic and was normal. Per patient has history of an abnormal Pap smear. Last Pap smear result is not available in Epic. 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 No skin abnormalities bilateral breasts. No nipple retraction bilateral breasts. No nipple discharge bilateral breasts. No lymphadenopathy. No lumps palpated bilateral breasts.       Pelvic/Bimanual Pap is not indicated today    Smoking History: Patient has never smoked and was not referred to quit line.    Patient Navigation: Patient education provided. Access to services provided for patient through [facility]CP program. Felicita Gage interpreter provided. No transportation provided   Colorectal Cancer Screening: Per patient has never had colonoscopy completed No complaints today.    Breast and Cervical Cancer Risk Assessment: Patient does not have family history of breast cancer, known genetic mutations, or radiation treatment to the chest before age 6. Patient does not have history of cervical dysplasia, immunocompromised, or DES exposure in-utero.  Risk Scores as of Encounter on 07/14/2023     [name]           5-year 0.4%   Lifetime 7.33%   This patient is [place] but has no documented birth country, so the [place] model used data from [place] patients to calculate their risk score. Document a birth country in the Demographics activity for a more accurate score.         Last calculated by Caprice Red, CMA on 07/14/2023 at 12:38 PM        A: B[facility]CP exam without pap smear No complaints with benign exam.   P: Referred patient to the [facility] of [facility] for a screening mammogram.  Appointment scheduled 07/14/2023.  Pascal Lux, NP 07/14/2023 12:50 PM

## 2023-07-14 NOTE — Patient Instructions (Signed)
Taught [name] about self breast awareness and gave educational materials to take home. Patient did not need a Pap smear today due to last Pap smear was in June 2024 per patient. . Let her know [facility]CP will cover Pap smears every 5 years unless has a history of abnormal Pap smears. Referred patient to the [facility] of [facility] for screening mammogram. Appointment scheduled for 08/001/2024. Patient aware of appointment and will be there. Let patient know will follow up with her within the next couple weeks with results. [name] verbalized understanding.  Pascal Lux, NP 12:55 PM

## 2024-07-02 ENCOUNTER — Other Ambulatory Visit: Payer: Self-pay | Admitting: Obstetrics and Gynecology

## 2024-07-02 DIAGNOSIS — Z1231 Encounter for screening mammogram for malignant neoplasm of breast: Secondary | ICD-10-CM

## 2024-09-27 ENCOUNTER — Ambulatory Visit: Payer: Self-pay | Admitting: *Deleted

## 2024-09-27 ENCOUNTER — Encounter

## 2024-09-27 VITALS — BP 133/94 | Wt 144.0 lb

## 2024-09-27 DIAGNOSIS — Z01419 Encounter for gynecological examination (general) (routine) without abnormal findings: Secondary | ICD-10-CM

## 2024-09-27 NOTE — Progress Notes (Signed)
 [name] is a 43 y.o. female who presents to [facility] clinic today with complaint of when she squeezes her bilateral breast that she is able to express a drop of clear fluid since she had her last baby in 2021. Patient had a screening mammogram completed 07/14/2023 that was negative.   Pap Smear: Pap smear completed today. Last Pap smear was 06/27/2020 at [facility] and was normal with negative HPV. Per patient has history of an abnormal Pap smear in 2017 that a colposcopy and LEEP 10/19/2016 that showed CIN 3. Last Pap smear result is available in Epic and per Dr. Izell a Pap smear in one year was recommended for follow up that patient has not had completed.   Physical exam: Breasts Breasts symmetrical. No skin abnormalities bilateral breasts. No nipple retraction bilateral breasts. No nipple discharge bilateral breasts. Unable to express any nipple discharge from bilateral breasts on exam. No lymphadenopathy. No lumps palpated bilateral breasts. No complaints of pain or tenderness on exam.  MS 3D SCR MAMMO BILAT BR (aka MM) Result Date: 07/15/2023 CLINICAL DATA:  Screening. EXAM: DIGITAL SCREENING BILATERAL MAMMOGRAM WITH TOMOSYNTHESIS AND CAD TECHNIQUE: Bilateral screening digital craniocaudal and mediolateral oblique mammograms were obtained. Bilateral screening digital breast tomosynthesis was performed. The images were evaluated with computer-aided detection. COMPARISON:  None available. ACR Breast Density Category c: The breasts are heterogeneously dense, which may obscure small masses. FINDINGS: There are no findings suspicious for malignancy. IMPRESSION: No mammographic evidence of malignancy. A result letter of this screening mammogram will be mailed directly to the patient. RECOMMENDATION: Screening mammogram in one year. (Code:SM-B-01Y) BI-RADS CATEGORY  1: Negative. Electronically Signed   By: Alm Parkins M.D.   On: 07/15/2023 15:52     Pelvic/Bimanual Ext Genitalia No lesions, no swelling and no discharge observed on external genitalia.        Vagina Vagina pink and normal texture. No lesions or discharge observed in vagina.        Cervix Cervix is present. Cervix pink and of normal texture. No discharge observed.    Uterus Uterus is present and palpable. Uterus in normal position and normal size.        Adnexae Bilateral ovaries present and palpable. No tenderness on palpation.         Rectovaginal No rectal exam completed today since patient had no rectal complaints. No skin abnormalities observed on exam.     Smoking History: Patient has never smoked.   Patient Navigation: Patient education provided. Access to services provided for patient through [place] program. Spanish interpreter Bernice Angry from [facility] provided.   Breast and Cervical Cancer Risk Assessment: Patient does not have family history of breast cancer, known genetic mutations, or radiation treatment to the chest before age 76. Patient has history of cervical dysplasia. Patient has no history of being immunocompromised or DES exposure in-utero.  Risk Scores as of Encounter on 09/27/2024     [name]           5-year 0.44%   Lifetime 7.27%   This patient is [place] but has no documented birth country, so the [place] model used data from [place] patients to calculate their risk score. Document a birth country in the Demographics activity for a more accurate score.         Last calculated by Silas, Ansyi K, CMA on 09/27/2024 at  2:26 PM        A: B[facility]CP exam with pap smear Complaint  of bilateral breast discharge when expressed since gave birth to last child.  P: Referred patient to the [facility] of [facility] for a screening mammogram on mobile unit. Appointment scheduled Thursday, October 04, 2024 at 1500.  Driscilla Wanda SQUIBB, RN 09/27/2024 9:38 PM   Driscilla, Wanda SQUIBB, RN 09/27/2024 2:33 PM

## 2024-09-27 NOTE — Patient Instructions (Signed)
 Explained breast self awareness with [name]. Pap smear completed today. Let patient know that next Pap smear will be based on the result of today's Pap smear. Referred patient to the [facility] of [facility] for a screening mammogram on mobile unit. Appointment scheduled Thursday, October 04, 2024 at 1500. Patient aware of appointment and will be there. Let patient know will follow up with her within the next couple weeks with results of Pap smear by letter or phone. Informed patient that the Breast Center will follow up with her within a couple weeks following mammogram appointment by letter or phone. [name] verbalized understanding.  [name], Wanda Ship, RN 2:33 PM

## 2024-10-03 LAB — CYTOLOGY - PAP
Adequacy: ABSENT
Comment: NEGATIVE
Diagnosis: NEGATIVE
High risk HPV: NEGATIVE

## 2024-10-04 ENCOUNTER — Ambulatory Visit: Payer: Self-pay

## 2024-10-04 ENCOUNTER — Ambulatory Visit
Admission: RE | Admit: 2024-10-04 | Discharge: 2024-10-04 | Disposition: A | Source: Ambulatory Visit | Attending: Obstetrics and Gynecology | Admitting: Obstetrics and Gynecology

## 2024-10-04 DIAGNOSIS — Z1231 Encounter for screening mammogram for malignant neoplasm of breast: Secondary | ICD-10-CM

## 2024-10-30 ENCOUNTER — Telehealth: Payer: Self-pay

## 2024-10-30 NOTE — Telephone Encounter (Signed)
 Using [address] /[place], ID: [id number], I have returned the pts call and advised of her results, Negative w/negative HPV. Pt expressed understanding of this information.

## 2024-10-30 NOTE — Telephone Encounter (Signed)
-----   Message from Luyando B sent at 10/30/2024  9:44 AM EST ----- Regarding: Telephone Message Hello,  This patient called and stated she did not receive her pap smear results. Informed patient letter mailed to her address and confirmed patient's address.  Please return patient's call.    Interpreter, Francee Sprung  Thanks, Vina
